# Patient Record
Sex: Female | Born: 1965 | Race: Black or African American | Hispanic: No | Marital: Married | Smoking: Never smoker
Health system: Southern US, Community
[De-identification: ages and names within clinical notes are randomized; demographics above are authoritative.]

## PROBLEM LIST (undated history)

## (undated) DIAGNOSIS — I10 Essential (primary) hypertension: Secondary | ICD-10-CM

## (undated) DIAGNOSIS — L309 Dermatitis, unspecified: Secondary | ICD-10-CM

## (undated) HISTORY — DX: Morbid (severe) obesity due to excess calories: E66.01

## (undated) HISTORY — DX: Essential (primary) hypertension: I10

## (undated) HISTORY — DX: Dermatitis, unspecified: L30.9

---

## 2008-01-31 DIAGNOSIS — L309 Dermatitis, unspecified: Secondary | ICD-10-CM

## 2008-01-31 HISTORY — DX: Dermatitis, unspecified: L30.9

## 2008-07-19 ENCOUNTER — Emergency Department (HOSPITAL_COMMUNITY): Admission: EM | Admit: 2008-07-19 | Discharge: 2008-07-19 | Payer: Self-pay | Admitting: Emergency Medicine

## 2010-08-28 ENCOUNTER — Emergency Department (HOSPITAL_COMMUNITY)
Admission: EM | Admit: 2010-08-28 | Discharge: 2010-08-28 | Disposition: A | Discharge status: Home / Self Care | Payer: Self-pay | Attending: Emergency Medicine | Admitting: Emergency Medicine

## 2010-08-28 ENCOUNTER — Emergency Department (HOSPITAL_COMMUNITY): Payer: Self-pay

## 2010-08-28 DIAGNOSIS — N390 Urinary tract infection, site not specified: Secondary | ICD-10-CM | POA: Insufficient documentation

## 2010-08-28 DIAGNOSIS — A599 Trichomoniasis, unspecified: Secondary | ICD-10-CM | POA: Insufficient documentation

## 2010-08-28 DIAGNOSIS — D259 Leiomyoma of uterus, unspecified: Secondary | ICD-10-CM | POA: Insufficient documentation

## 2010-08-28 DIAGNOSIS — R1031 Right lower quadrant pain: Secondary | ICD-10-CM | POA: Insufficient documentation

## 2010-08-28 DIAGNOSIS — R112 Nausea with vomiting, unspecified: Secondary | ICD-10-CM | POA: Insufficient documentation

## 2010-08-28 LAB — BASIC METABOLIC PANEL
BUN: 11 mg/dL (ref 6–23)
Calcium: 9.6 mg/dL (ref 8.4–10.5)
Creatinine, Ser: 0.92 mg/dL (ref 0.50–1.10)
GFR calc Af Amer: >60 mL/min (ref >60)
GFR calc non Af Amer: >60 mL/min (ref >60)
Glucose, Bld: 102 mg/dL — ABNORMAL HIGH (ref 70–99)

## 2010-08-28 LAB — DIFFERENTIAL
Basophils Absolute: 0 10*3/uL (ref 0.0–0.1)
Eosinophils Relative: 1 % (ref 0–5)
Lymphocytes Relative: 20 % (ref 12–46)
Lymphs Abs: 1.7 10*3/uL (ref 0.7–4.0)
Monocytes Absolute: 1.4 10*3/uL — ABNORMAL HIGH (ref 0.1–1.0)
Monocytes Relative: 16 % — ABNORMAL HIGH (ref 3–12)
Neutro Abs: 5.6 10*3/uL (ref 1.7–7.7)

## 2010-08-28 LAB — URINE MICROSCOPIC-ADD ON

## 2010-08-28 LAB — URINALYSIS, ROUTINE W REFLEX MICROSCOPIC
Glucose, UA: NEGATIVE mg/dL
Nitrite: NEGATIVE
Protein, ur: 30 mg/dL — AB

## 2010-08-28 LAB — CBC
HCT: 37.1 % (ref 36.0–46.0)
Hemoglobin: 13.5 g/dL (ref 12.0–15.0)
MCH: 33.3 pg (ref 26.0–34.0)
MCHC: 36.4 g/dL — ABNORMAL HIGH (ref 30.0–36.0)
MCV: 91.6 fL (ref 78.0–100.0)
RDW: 14 % (ref 11.5–15.5)

## 2010-08-28 LAB — POCT PREGNANCY, URINE: Preg Test, Ur: NEGATIVE

## 2010-08-28 LAB — WET PREP, GENITAL: Clue Cells Wet Prep HPF POC: NONE SEEN

## 2010-08-28 MED ORDER — IOHEXOL 300 MG/ML  SOLN
120.0000 mL | Freq: Once | INTRAMUSCULAR | Status: AC | PRN
  Administered 2010-08-28: 120 mL via INTRAVENOUS

## 2010-08-29 LAB — GC/CHLAMYDIA PROBE AMP, GENITAL: Chlamydia, DNA Probe: NEGATIVE

## 2011-10-12 ENCOUNTER — Telehealth: Payer: Self-pay | Admitting: Family Medicine

## 2011-10-12 NOTE — Telephone Encounter (Signed)
Caller: [name]/Patient; Patient Name: Melanie Bradshaw; PCP: Eustaquio Boyden [facility]); Best Callback Phone Number: [phone]; Reason for call: Cough/Congestion.  Patient states she was seen by Dr. Shawn Route MD last week 10/06/11  for a viral/laryngitis. She was placed on a Zpack and took her last pill on Tuesday 10/10/11. She reports that she is so much better but has developed a slight cough.   She states she feels good just low energy. non productive cough. Eating and drinking well. Afebrile. Voice is clear. Emergent s/sx ruled out per Cough protocol with exception to "Dry cough". Home care instructions provided. Encouraged to closely mointor signs and symptoms. Call back for questions, changes or concerns.

## 2011-10-12 NOTE — Telephone Encounter (Signed)
Noted thanks °

## 2012-12-18 ENCOUNTER — Ambulatory Visit: Payer: No Typology Code available for payment source | Attending: Internal Medicine | Admitting: Internal Medicine

## 2012-12-18 ENCOUNTER — Encounter: Payer: Self-pay | Admitting: Internal Medicine

## 2012-12-18 VITALS — BP 147/95 | HR 71 | Temp 98.6°F | Ht 64.0 in | Wt 248.6 lb

## 2012-12-18 DIAGNOSIS — E119 Type 2 diabetes mellitus without complications: Secondary | ICD-10-CM

## 2012-12-18 LAB — LIPID PANEL
HDL: 67 mg/dL (ref >39)
LDL Cholesterol: 104 mg/dL — ABNORMAL HIGH (ref 0–99)
Total CHOL/HDL Ratio: 2.8 Ratio
VLDL: 19 mg/dL (ref 0–40)

## 2012-12-18 LAB — CBC WITH DIFFERENTIAL/PLATELET
Basophils Absolute: 0 10*3/uL (ref 0.0–0.1)
Eosinophils Relative: 3 % (ref 0–5)
HCT: 35 % — ABNORMAL LOW (ref 36.0–46.0)
Lymphocytes Relative: 32 % (ref 12–46)
Lymphs Abs: 1.6 10*3/uL (ref 0.7–4.0)
MCV: 90.4 fL (ref 78.0–100.0)
Monocytes Absolute: 0.7 10*3/uL (ref 0.1–1.0)
Neutro Abs: 2.6 10*3/uL (ref 1.7–7.7)
Platelets: 257 10*3/uL (ref 150–400)
RBC: 3.87 MIL/uL (ref 3.87–5.11)
RDW: 14.9 % (ref 11.5–15.5)
WBC: 5.1 10*3/uL (ref 4.0–10.5)

## 2012-12-18 LAB — COMPLETE METABOLIC PANEL WITH GFR
ALT: 12 U/L (ref 0–35)
AST: 17 U/L (ref 0–37)
Albumin: 3.8 g/dL (ref 3.5–5.2)
CO2: 29 mEq/L (ref 19–32)
Calcium: 9 mg/dL (ref 8.4–10.5)
Chloride: 103 mEq/L (ref 96–112)
GFR, Est African American: 82 mL/min
Potassium: 3.6 mEq/L (ref 3.5–5.3)

## 2012-12-18 MED ORDER — KETOCONAZOLE 2 % EX CREA: 1.0000 "application " | TOPICAL_CREAM | Freq: Every day | CUTANEOUS | Status: DC

## 2012-12-18 MED ORDER — TRIAMCINOLONE ACETONIDE 0.1 % EX CREA: 1.0000 "application " | TOPICAL_CREAM | Freq: Two times a day (BID) | CUTANEOUS | Status: DC

## 2012-12-18 MED ORDER — CLOBETASOL PROP EMOLLIENT BASE 0.05 % EX CREA: 1.0000 | TOPICAL_CREAM | Freq: Two times a day (BID) | CUTANEOUS | Status: DC

## 2012-12-18 MED ORDER — AMLODIPINE BESYLATE 10 MG PO TABS: 10.0000 mg | ORAL_TABLET | Freq: Every day | ORAL | Status: DC

## 2012-12-18 NOTE — Progress Notes (Unsigned)
Patient ID: Melanie Bradshaw, female   DOB: 06/30/1965, 47 y.o.   MRN: [id number]  CC:  HPI:  47 year old female is here to establish care The patient primarily complains of an excoriating, eczematous Dermatitis on both her hands. She works as a Chemical engineer, she is unaware of allergy to latex. She does not have the rash anywhere else on her body. She has recently not used any new soaps and shampoos  She denies any joint pain, chest pain, shortness of breath or any other cardiopulmonary symptoms She is a nonsmoker She is morbidly obese She states that she has a history of high blood pressure needs to be on Norvasc which she had to discontinue She is currently not up-to-date with her immunizations or her Pap smear/breast exam   Not on File History reviewed. No pertinent past medical history. No current outpatient prescriptions on file prior to visit.   No current facility-administered medications on file prior to visit.   Family History  Problem Relation Age of Onset  . Diabetes Mother   . Hypertension Mother   . Diabetes Sister   . Diabetes Brother    History   Social History  . Marital Status: Single    Spouse Name: N/A    Number of Children: N/A  . Years of Education: N/A   Occupational History  . Not on file.   Social History Main Topics  . Smoking status: Never Smoker   . Smokeless tobacco: Not on file  . Alcohol Use: No  . Drug Use: No  . Sexual Activity: Not on file   Other Topics Concern  . Not on file   Social History Narrative  . No narrative on file    Review of Systems  Constitutional: Negative for fever, chills, diaphoresis, activity change, appetite change and fatigue.  HENT: Negative for ear pain, nosebleeds, congestion, facial swelling, rhinorrhea, neck pain, neck stiffness and ear discharge.   Eyes: Negative for pain, discharge, redness, itching and visual disturbance.  Respiratory: Negative for cough, choking, chest tightness, shortness of  breath, wheezing and stridor.   Cardiovascular: Negative for chest pain, palpitations and leg swelling.  Gastrointestinal: Negative for abdominal distention.  Genitourinary: Negative for dysuria, urgency, frequency, hematuria, flank pain, decreased urine volume, difficulty urinating and dyspareunia.  Musculoskeletal: Negative for back pain, joint swelling, arthralgias and gait problem.  Neurological: Negative for dizziness, tremors, seizures, syncope, facial asymmetry, speech difficulty, weakness, light-headedness, numbness and headaches.  Hematological: Negative for adenopathy. Does not bruise/bleed easily.  Psychiatric/Behavioral: Negative for hallucinations, behavioral problems, confusion, dysphoric mood, decreased concentration and agitation.    Objective:   Filed Vitals:   12/18/12 1631  BP: 147/95  Pulse: 71  Temp: 98.6 F (37 C)    Physical Exam  Constitutional: Appears well-developed and well-nourished. No distress.  HENT: Normocephalic. External right and left ear normal. Oropharynx is clear and moist.  Eyes: Conjunctivae and EOM are normal. PERRLA, no scleral icterus.  Neck: Normal ROM. Neck supple. No JVD. No tracheal deviation. No thyromegaly.  CVS: RRR, S1/S2 +, no murmurs, no gallops, no carotid bruit.  Pulmonary: Effort and breath sounds normal, no stridor, rhonchi, wheezes, rales.  Abdominal: Soft. BS +,  no distension, tenderness, rebound or guarding.  Musculoskeletal: Normal range of motion. No edema and no tenderness.  Lymphadenopathy: No lymphadenopathy noted, cervical, inguinal. Neuro: Alert. Normal reflexes, muscle tone coordination. No cranial nerve deficit. Skin: Skin is warm and dry. No rash noted. Not diaphoretic. No erythema. No pallor.  Psychiatric: Normal mood and affect. Behavior, judgment, thought content normal.   Lab Results  Component Value Date   WBC 8.8 08/28/2010   HGB 13.5 08/28/2010   HCT 37.1 08/28/2010   MCV 91.6 08/28/2010   PLT 221  08/28/2010   Lab Results  Component Value Date   CREATININE 0.92 08/28/2010   BUN 11 08/28/2010   NA 134* 08/28/2010   K 3.0* 08/28/2010   CL 97 08/28/2010   CO2 25 08/28/2010    No results found for this basename: HGBA1C   Lipid Panel  No results found for this basename: chol, trig, hdl, cholhdl, vldl, ldlcalc       Assessment and plan:   There are no active problems to display for this patient.      Contact dermatitis The patient has been prescribed clobetasol ointment Dermatology referral   Tinea corporis Ketoconazole shampoo   Hypertension Norvasc 10 mg a day  Healthcare maintenance Pap smear/breast exam-gynecologic referral Will provide flu vaccination Routine labs including kidney function CBC, lipid panel, A1c, TSH, vitamin D   Followup in 2 months for a blood pressure check  The patient was given clear instructions to go to ER or return to medical center if symptoms don't improve, worsen or new problems develop. The patient verbalized understanding. The patient was told to call to get any lab results if not heard anything in the next week.

## 2012-12-18 NOTE — Progress Notes (Unsigned)
Pt is here to establish care. Pt complains of possible hypertension; BP going up and down consistently. Also complains of mild breakout (rash) on bilateral hands with severe itching. Pt requests a HBA1c to determine diabetes due to family history.

## 2012-12-19 LAB — HEMOGLOBIN A1C
Hgb A1c MFr Bld: 5.8 % — ABNORMAL HIGH (ref <5.7)
Mean Plasma Glucose: 120 mg/dL — ABNORMAL HIGH (ref <117)

## 2013-02-17 ENCOUNTER — Encounter: Payer: Self-pay | Admitting: Internal Medicine

## 2013-02-17 ENCOUNTER — Ambulatory Visit: Payer: No Typology Code available for payment source | Attending: Internal Medicine | Admitting: Internal Medicine

## 2013-02-17 VITALS — BP 132/91 | HR 67 | Temp 98.9°F | Resp 14 | Ht 64.0 in | Wt 260.2 lb

## 2013-02-17 DIAGNOSIS — H01139 Eczematous dermatitis of unspecified eye, unspecified eyelid: Secondary | ICD-10-CM

## 2013-02-17 DIAGNOSIS — R21 Rash and other nonspecific skin eruption: Secondary | ICD-10-CM | POA: Insufficient documentation

## 2013-02-17 MED ORDER — TRIAMCINOLONE ACETONIDE 0.1 % EX CREA: 1.0000 "application " | TOPICAL_CREAM | Freq: Two times a day (BID) | CUTANEOUS | Status: DC

## 2013-02-17 NOTE — Progress Notes (Signed)
Patient ID: Melanie Bradshaw, female   DOB: 12-12-1965, 48 y.o.   MRN: [id number]   CC:  HPI: 48 year old female who is here for followup of her rash most likely contact dermatitis, responding to triamcinolone ointment. She is yet to see a dermatologist.   Not on File No past medical history on file. Current Outpatient Prescriptions on File Prior to Visit  Medication Sig Dispense Refill  . amLODipine (NORVASC) 10 MG tablet Take 1 tablet (10 mg total) by mouth daily.  90 tablet  3  . ketoconazole (NIZORAL) 2 % cream Apply 1 application topically daily.  15 g  0   No current facility-administered medications on file prior to visit.   Family History  Problem Relation Age of Onset  . Diabetes Mother   . Hypertension Mother   . Diabetes Sister   . Diabetes Brother    History   Social History  . Marital Status: Single    Spouse Name: N/A    Number of Children: N/A  . Years of Education: N/A   Occupational History  . Not on file.   Social History Main Topics  . Smoking status: Never Smoker   . Smokeless tobacco: Not on file  . Alcohol Use: No  . Drug Use: No  . Sexual Activity: Not on file   Other Topics Concern  . Not on file   Social History Narrative  . No narrative on file    Review of Systems  Constitutional: Negative for fever, chills, diaphoresis, activity change, appetite change and fatigue.  HENT: Negative for ear pain, nosebleeds, congestion, facial swelling, rhinorrhea, neck pain, neck stiffness and ear discharge.   Eyes: Negative for pain, discharge, redness, itching and visual disturbance.  Respiratory: Negative for cough, choking, chest tightness, shortness of breath, wheezing and stridor.   Cardiovascular: Negative for chest pain, palpitations and leg swelling.  Gastrointestinal: Negative for abdominal distention.  Genitourinary: Negative for dysuria, urgency, frequency, hematuria, flank pain, decreased urine volume, difficulty urinating and dyspareunia.   Musculoskeletal: Negative for back pain, joint swelling, arthralgias and gait problem.  Neurological: Negative for dizziness, tremors, seizures, syncope, facial asymmetry, speech difficulty, weakness, light-headedness, numbness and headaches.  Hematological: Negative for adenopathy. Does not bruise/bleed easily.  Psychiatric/Behavioral: Negative for hallucinations, behavioral problems, confusion, dysphoric mood, decreased concentration and agitation.    Objective:   Filed Vitals:   02/17/13 1504  BP: 132/91  Pulse: 67  Temp: 98.9 F (37.2 C)  Resp: 14    Physical Exam  Constitutional: Appears well-developed and well-nourished. No distress.  HENT: Normocephalic. External right and left ear normal. Oropharynx is clear and moist.  Eyes: Conjunctivae and EOM are normal. PERRLA, no scleral icterus.  Neck: Normal ROM. Neck supple. No JVD. No tracheal deviation. No thyromegaly.  CVS: RRR, S1/S2 +, no murmurs, no gallops, no carotid bruit.  Pulmonary: Effort and breath sounds normal, no stridor, rhonchi, wheezes, rales.  Abdominal: Soft. BS +,  no distension, tenderness, rebound or guarding.  Musculoskeletal: Normal range of motion. No edema and no tenderness.  Lymphadenopathy: No lymphadenopathy noted, cervical, inguinal. Neuro: Alert. Normal reflexes, muscle tone coordination. No cranial nerve deficit. Skin: Excoriated macular rash on both palms dorsal and palmar aspect Psychiatric: Normal mood and affect. Behavior, judgment, thought content normal.   Lab Results  Component Value Date   WBC 5.1 12/18/2012   HGB 12.0 12/18/2012   HCT 35.0* 12/18/2012   MCV 90.4 12/18/2012   PLT 257 12/18/2012   Lab Results  Component Value  Date   CREATININE 0.95 12/18/2012   BUN 11 12/18/2012   NA 139 12/18/2012   K 3.6 12/18/2012   CL 103 12/18/2012   CO2 29 12/18/2012    Lab Results  Component Value Date   HGBA1C 5.8* 12/18/2012   Lipid Panel     Component Value Date/Time   CHOL  190 12/18/2012 1649   TRIG 93 12/18/2012 1649   HDL 67 12/18/2012 1649   CHOLHDL 2.8 12/18/2012 1649   VLDL 19 12/18/2012 1649   LDLCALC 104* 12/18/2012 1649       Assessment and plan:   There are no active problems to display for this patient.      Contact dermatitis Continue Kenalog ointment Dermatology referral provided   followup as needed sooner otherwise in 3 months    The patient was given clear instructions to go to ER or return to medical center if symptoms don't improve, worsen or new problems develop. The patient verbalized understanding. The patient was told to call to get any lab results if not heard anything in the next week.

## 2013-02-17 NOTE — Progress Notes (Signed)
Pt is here for a f/u. The cream prescribed has helped. Requests a refill for creams.

## 2013-02-26 ENCOUNTER — Encounter: Payer: Self-pay | Admitting: Obstetrics & Gynecology

## 2013-02-26 ENCOUNTER — Ambulatory Visit (INDEPENDENT_AMBULATORY_CARE_PROVIDER_SITE_OTHER): Payer: No Typology Code available for payment source | Admitting: Obstetrics & Gynecology

## 2013-02-26 VITALS — BP 121/84 | HR 74 | Temp 98.6°F | Ht 64.0 in | Wt 245.2 lb

## 2013-02-26 DIAGNOSIS — Z0000 Encounter for general adult medical examination without abnormal findings: Secondary | ICD-10-CM

## 2013-02-26 NOTE — Progress Notes (Signed)
Subjective:    Melanie Bradshaw is a 48 y.o. female who presents for an annual exam. The patient has no complaints today. She has monthly periods that last 5 days. The patient is sexually active. GYN screening history: last pap: was normal. The patient wears seatbelts: yes. The patient participates in regular exercise: yes. Has the patient ever been transfused or tattooed?: no. The patient reports that there is not domestic violence in her life.   Menstrual History: OB History   Grav Para Term Preterm Abortions TAB SAB Ect Mult Living                  Menarche age: 59 Patient's last menstrual period was 02/10/2013.    The following portions of the patient's history were reviewed and updated as appropriate: allergies, current medications, past family history, past medical history, past social history, past surgical history and problem list.  Review of Systems A comprehensive review of systems was negative. She uses condoms when she has sex. She lives with her boyfriend for 6 years. 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 She had a flu vaccine. She thinks that her last mammogram was within the last 2 years.   Objective:    BP 121/84  Pulse 74  Temp(Src) 98.6 F (37 C)  Ht 5\' 4"  (1.626 m)  Wt 245 lb 3.2 oz (111.222 kg)  BMI 42.07 kg/m2  LMP 02/10/2013  General Appearance:    Alert, cooperative, no distress, appears stated age  Head:    Normocephalic, without obvious abnormality, atraumatic  Eyes:    PERRL, conjunctiva/corneas clear, EOM's intact, fundi    benign, both eyes  Ears:    Normal TM's and external ear canals, both ears  Nose:   Nares normal, septum midline, mucosa normal, no drainage    or sinus tenderness  Throat:   Lips, mucosa, and tongue normal; teeth and gums normal  Neck:   Supple, symmetrical, trachea midline, no adenopathy;    thyroid:  no enlargement/tenderness/nodules; no carotid   bruit or JVD  Back:     Symmetric, no curvature, ROM normal, no CVA tenderness  Lungs:     Clear to  auscultation bilaterally, respirations unlabored  Chest Wall:    No tenderness or deformity   Heart:    Regular rate and rhythm, S1 and S2 normal, no murmur, rub   or gallop  Breast Exam:    No tenderness, masses, or nipple abnormality  Abdomen:     Soft, non-tender, bowel sounds active all four quadrants,    no masses, no organomegaly  Genitalia:    Normal female without lesion, discharge or tenderness, NSSA, NT, no adnexal masses felt on exam     Extremities:   Extremities normal, atraumatic, no cyanosis or edema  Pulses:   2+ and symmetric all extremities  Skin:   Skin color, texture, turgor normal, no rashes or lesions  Lymph nodes:   Cervical, supraclavicular, and axillary nodes normal  Neurologic:   CNII-XII intact, normal strength, sensation and reflexes    throughout  .    Assessment:    Healthy female exam.  She requests STI testing   Plan:     Breast self exam technique reviewed and patient encouraged to perform self-exam monthly. Mammogram. Thin prep Pap smear. with cotesting

## 2013-02-26 NOTE — Patient Instructions (Signed)

## 2013-02-27 LAB — HIV ANTIBODY (ROUTINE TESTING W REFLEX): HIV: NONREACTIVE

## 2013-02-27 LAB — RPR

## 2013-02-27 LAB — HEPATITIS C ANTIBODY: HCV AB: NEGATIVE

## 2013-02-27 LAB — HEPATITIS B SURFACE ANTIGEN: HEP B S AG: NEGATIVE

## 2013-03-03 ENCOUNTER — Encounter: Payer: Self-pay | Admitting: *Deleted

## 2013-03-04 ENCOUNTER — Telehealth: Payer: Self-pay | Admitting: *Deleted

## 2013-03-04 DIAGNOSIS — A599 Trichomoniasis, unspecified: Secondary | ICD-10-CM

## 2013-03-04 MED ORDER — METRONIDAZOLE 500 MG PO TABS: 2000.0000 mg | ORAL_TABLET | Freq: Once | ORAL | Status: DC

## 2013-03-04 NOTE — Telephone Encounter (Signed)
Pt called the front desk and I informed pt that she tested positive for Trich.  I explained to her that it was an STD and that treatment was sent to her [place] and to please take all four pills at one time to be treated properly.  To also get her partner(s) treated as well before having intercourse.  Pt stated understanding with no further questions.

## 2013-03-04 NOTE — Telephone Encounter (Signed)
Message copied by Sue Lush on Tue Mar 04, 2013  2:46 PM ------      Message from: Clovia Cuff C      Created: Mon Mar 03, 2013  1:41 PM       She needs 2 grams of flagyl po once for trich. Her partner should be treated before she has sex with him again. ------

## 2013-03-04 NOTE — Telephone Encounter (Signed)
Message from Dr. Hulan Fray to order flagyl 2 gms one time does for trich.  Ordered.  Attempted to call pt, left message for her to call back to the clinic.

## 2013-03-11 ENCOUNTER — Ambulatory Visit: Payer: No Typology Code available for payment source | Attending: Internal Medicine

## 2013-03-17 ENCOUNTER — Ambulatory Visit (HOSPITAL_COMMUNITY)
Admission: RE | Admit: 2013-03-17 | Discharge: 2013-03-17 | Disposition: A | Discharge status: Home / Self Care | Payer: No Typology Code available for payment source | Source: Ambulatory Visit | Attending: Obstetrics & Gynecology | Admitting: Obstetrics & Gynecology

## 2013-03-17 DIAGNOSIS — Z0000 Encounter for general adult medical examination without abnormal findings: Secondary | ICD-10-CM

## 2013-05-19 ENCOUNTER — Ambulatory Visit: Payer: No Typology Code available for payment source | Attending: Internal Medicine | Admitting: Internal Medicine

## 2013-05-19 ENCOUNTER — Encounter: Payer: Self-pay | Admitting: Internal Medicine

## 2013-05-19 VITALS — BP 148/99 | HR 63 | Temp 98.4°F | Resp 16 | Ht 64.0 in | Wt 254.0 lb

## 2013-05-19 DIAGNOSIS — I10 Essential (primary) hypertension: Secondary | ICD-10-CM

## 2013-05-19 DIAGNOSIS — N390 Urinary tract infection, site not specified: Secondary | ICD-10-CM

## 2013-05-19 DIAGNOSIS — L259 Unspecified contact dermatitis, unspecified cause: Secondary | ICD-10-CM

## 2013-05-19 DIAGNOSIS — Z09 Encounter for follow-up examination after completed treatment for conditions other than malignant neoplasm: Secondary | ICD-10-CM | POA: Insufficient documentation

## 2013-05-19 LAB — POCT URINALYSIS DIPSTICK
Bilirubin, UA: NEGATIVE
GLUCOSE UA: NEGATIVE
KETONES UA: NEGATIVE
Nitrite, UA: NEGATIVE
PROTEIN UA: NEGATIVE
Spec Grav, UA: 1.02
Urobilinogen, UA: 0.2
pH, UA: 5.5

## 2013-05-19 MED ORDER — AMLODIPINE BESYLATE 10 MG PO TABS: 10.0000 mg | ORAL_TABLET | Freq: Every day | ORAL | Status: DC

## 2013-05-19 MED ORDER — CIPROFLOXACIN HCL 500 MG PO TABS: 500.0000 mg | ORAL_TABLET | Freq: Two times a day (BID) | ORAL | Status: DC

## 2013-05-19 NOTE — Patient Instructions (Signed)
Urinary Tract Infection  Urinary tract infections (UTIs) can develop anywhere along your urinary tract. Your urinary tract is your body's drainage system for removing wastes and extra water. Your urinary tract includes two kidneys, two ureters, a bladder, and a urethra. Your kidneys are a pair of bean-shaped organs. Each kidney is about the size of your fist. They are located below your ribs, one on each side of your spine.  CAUSES  Infections are caused by microbes, which are microscopic organisms, including fungi, viruses, and bacteria. These organisms are so small that they can only be seen through a microscope. Bacteria are the microbes that most commonly cause UTIs.  SYMPTOMS   Symptoms of UTIs may vary by age and gender of the patient and by the location of the infection. Symptoms in young women typically include a frequent and intense urge to urinate and a painful, burning feeling in the bladder or urethra during urination. Older women and men are more likely to be tired, shaky, and weak and have muscle aches and abdominal pain. A fever may mean the infection is in your kidneys. Other symptoms of a kidney infection include pain in your back or sides below the ribs, nausea, and vomiting.  DIAGNOSIS  To diagnose a UTI, your caregiver will ask you about your symptoms. Your caregiver also will ask to provide a urine sample. The urine sample will be tested for bacteria and white blood cells. White blood cells are made by your body to help fight infection.  TREATMENT   Typically, UTIs can be treated with medication. Because most UTIs are caused by a bacterial infection, they usually can be treated with the use of antibiotics. The choice of antibiotic and length of treatment depend on your symptoms and the type of bacteria causing your infection.  HOME CARE INSTRUCTIONS   If you were prescribed antibiotics, take them exactly as your caregiver instructs you. Finish the medication even if you feel better after you  have only taken some of the medication.   Drink enough water and fluids to keep your urine clear or pale yellow.   Avoid caffeine, tea, and carbonated beverages. They tend to irritate your bladder.   Empty your bladder often. Avoid holding urine for long periods of time.   Empty your bladder before and after sexual intercourse.   After a bowel movement, women should cleanse from front to back. Use each tissue only once.  SEEK MEDICAL CARE IF:    You have back pain.   You develop a fever.   Your symptoms do not begin to resolve within 3 days.  SEEK IMMEDIATE MEDICAL CARE IF:    You have severe back pain or lower abdominal pain.   You develop chills.   You have nausea or vomiting.   You have continued burning or discomfort with urination.  MAKE SURE YOU:    Understand these instructions.   Will watch your condition.   Will get help right away if you are not doing well or get worse.  Document Released: 10/26/2004 Document Revised: 07/18/2011 Document Reviewed: 02/24/2011  ExitCare Patient Information 2014 [employer].

## 2013-05-19 NOTE — Progress Notes (Signed)
Patient ID: Melanie Bradshaw, female   DOB: 1965-11-29, 48 y.o.   MRN: [id number]   Melanie Bradshaw, is a 48 y.o. female  [id number]  [id number]  DOB - 02-16-1965  Chief Complaint  Patient presents with  . Follow-up        Subjective:   Melanie Bradshaw is a 48 y.o. female here today for a follow up visit. Patient has history of hypertension on Norvasc 10 mg tablet by mouth daily, she has not taken her medication today because she ran out. She is here today for followup. Her major complaint is frequency of urination, she also thinks her urine is foul smelling but no dysuria. She was recently treated for Trichomonas. She denies any vaginal discharge. She claims there is significant improvement on her contact dermatitis in both hands.  Patient has No headache, No chest pain, No abdominal pain - No Nausea, No new weakness tingling or numbness, No Cough - SOB.  Problem  Uti (Urinary Tract Infection)  Contact Dermatitis  Htn (Hypertension)    ALLERGIES: No Known Allergies  PAST MEDICAL HISTORY: Past Medical History  Diagnosis Date  . Morbid obesity     MEDICATIONS AT HOME: Prior to Admission medications   Medication Sig Start Date End Date Taking? Authorizing Provider  amLODipine (NORVASC) 10 MG tablet Take 1 tablet (10 mg total) by mouth daily. 05/19/13  Yes Melanie Chessman, MD  ciprofloxacin (CIPRO) 500 MG tablet Take 1 tablet (500 mg total) by mouth 2 (two) times daily. 05/19/13   Melanie Chessman, MD  ketoconazole (NIZORAL) 2 % cream Apply 1 application topically daily. 12/18/12   Reyne Dumas, MD  metroNIDAZOLE (FLAGYL) 500 MG tablet Take 4 tablets (2,000 mg total) by mouth once. 03/04/13   Osborne Oman, MD  triamcinolone cream (KENALOG) 0.1 % Apply 1 application topically 2 (two) times daily. Both hands 02/17/13   Reyne Dumas, MD     Objective:   Filed Vitals:   05/19/13 1407  BP: 148/99  Pulse: 63  Temp: 98.4 F (36.9 C)  TempSrc: Oral  Resp: 16  Height: 5\' 4"   (1.626 m)  Weight: 254 lb (115.214 kg)  SpO2: 97%    Exam General appearance : Awake, alert, not in any distress. Speech Clear. Not toxic looking, morbidly obese, poor dentition HEENT: Atraumatic and Normocephalic, pupils equally reactive to light and accomodation Neck: supple, no JVD. No cervical lymphadenopathy.  Chest:Good air entry bilaterally, no added sounds  CVS: S1 S2 regular, no murmurs.  Abdomen: Bowel sounds present, Non tender and not distended with no gaurding, rigidity or rebound. Extremities: B/L Lower Ext shows no edema, both legs are warm to touch Neurology: Awake alert, and oriented X 3, CN II-XII intact, Non focal Skin: rash on both hands in a glove distribution, also facial rash mostly around the eye lids  Data Review Lab Results  Component Value Date   HGBA1C 5.8* 12/18/2012     Assessment & Plan   1. HTN (hypertension) Refill - amLODipine (NORVASC) 10 MG tablet; Take 1 tablet (10 mg total) by mouth daily.  Dispense: 90 tablet; Refill: 3 DASH diet  2. Contact dermatitis Continue triamcinolone cream 0.1%  3. UTI (urinary tract infection)  - CULTURE, URINE COMPREHENSIVE - ciprofloxacin (CIPRO) 500 MG tablet; Take 1 tablet (500 mg total) by mouth 2 (two) times daily.  Dispense: 10 tablet; Refill: 0  Patient was counseled extensively on nutrition and exercise  Return in about 6 months (around 11/18/2013), or if symptoms worsen or fail  to improve, for Follow up HTN.  The patient was given clear instructions to go to ER or return to medical center if symptoms don't improve, worsen or new problems develop. The patient verbalized understanding. The patient was told to call to get lab results if they haven't heard anything in the next week.   This note has been created with Surveyor, quantity. Any transcriptional errors are unintentional.    Melanie Chessman, MD, [place], [place], [place] and  [facility] [place], [place]   05/19/2013, 2:42 PM

## 2013-05-19 NOTE — Progress Notes (Signed)
Pt is here following up on her HTN and eczema. Pt has no new C.C. Today.

## 2013-05-21 LAB — CULTURE, URINE COMPREHENSIVE

## 2013-06-13 ENCOUNTER — Other Ambulatory Visit: Payer: Self-pay

## 2013-06-13 DIAGNOSIS — H01139 Eczematous dermatitis of unspecified eye, unspecified eyelid: Secondary | ICD-10-CM

## 2013-10-09 ENCOUNTER — Ambulatory Visit: Payer: Self-pay

## 2013-11-18 ENCOUNTER — Ambulatory Visit: Payer: Self-pay | Admitting: Internal Medicine

## 2013-11-24 ENCOUNTER — Encounter: Payer: Self-pay | Admitting: Internal Medicine

## 2013-11-24 ENCOUNTER — Ambulatory Visit: Payer: Self-pay | Attending: Internal Medicine | Admitting: Internal Medicine

## 2013-11-24 VITALS — BP 164/100 | HR 57 | Temp 98.7°F | Resp 14 | Ht 64.0 in | Wt 248.0 lb

## 2013-11-24 DIAGNOSIS — L309 Dermatitis, unspecified: Secondary | ICD-10-CM | POA: Insufficient documentation

## 2013-11-24 DIAGNOSIS — Z792 Long term (current) use of antibiotics: Secondary | ICD-10-CM | POA: Insufficient documentation

## 2013-11-24 DIAGNOSIS — Z23 Encounter for immunization: Secondary | ICD-10-CM | POA: Insufficient documentation

## 2013-11-24 DIAGNOSIS — I10 Essential (primary) hypertension: Secondary | ICD-10-CM | POA: Insufficient documentation

## 2013-11-24 MED ORDER — AMLODIPINE BESYLATE 10 MG PO TABS: 10.0000 mg | ORAL_TABLET | Freq: Every day | ORAL | Status: DC

## 2013-11-24 NOTE — Progress Notes (Signed)
Patient ID: Melanie Bradshaw, female   DOB: 05-26-1965, 48 y.o.   MRN: [id number]   Melanie Bradshaw, is a 48 y.o. female  [id number]  [id number]  DOB - 1965-12-03  Chief Complaint  Patient presents with  . Follow-up        Subjective:   Melanie Bradshaw is a 48 y.o. female here today for a follow up visit. PMH significant for HTN and eczema.  She does not smoke and she consumes 1 beer/night.  She reports that she is unable to afford her antihypertensive medication because she does not receive many hours at work.  She denies any specific complaints and she is requesting her influenza vaccination today.  ALLERGIES: No Known Allergies  PAST MEDICAL HISTORY: Past Medical History  Diagnosis Date  . Morbid obesity     MEDICATIONS AT HOME: Prior to Admission medications   Medication Sig Start Date End Date Taking? Authorizing Provider  amLODipine (NORVASC) 10 MG tablet Take 1 tablet (10 mg total) by mouth daily. 05/19/13   Tresa Garter, MD  ciprofloxacin (CIPRO) 500 MG tablet Take 1 tablet (500 mg total) by mouth 2 (two) times daily. 05/19/13   Tresa Garter, MD  ketoconazole (NIZORAL) 2 % cream Apply 1 application topically daily. 12/18/12   Reyne Dumas, MD  metroNIDAZOLE (FLAGYL) 500 MG tablet Take 4 tablets (2,000 mg total) by mouth once. 03/04/13   Osborne Oman, MD  triamcinolone cream (KENALOG) 0.1 % Apply 1 application topically 2 (two) times daily. Both hands 02/17/13   Reyne Dumas, MD   Review of Systems  Constitutional: Negative.   HENT: Negative.   Eyes: Negative.   Respiratory: Negative.   Cardiovascular: Negative.   Gastrointestinal: Negative.   Genitourinary: Negative.   Musculoskeletal: Negative.   Skin: Positive for rash.       chronic  Neurological: Negative.   Endo/Heme/Allergies: Negative.   Psychiatric/Behavioral: Negative.      Objective:   Filed Vitals:   11/24/13 1148  BP: 164/100  Pulse: 57  Temp: 98.7 F (37.1 C)  TempSrc: Oral    Resp: 14  Height: 5\' 4"  (1.626 m)  Weight: 248 lb (112.492 kg)  SpO2: 99%    Exam General appearance : Awake, alert, not in any distress. Speech Clear. Not toxic looking HEENT: Atraumatic and Normocephalic, pupils equally reactive to light and accomodation Neck: supple, no JVD. No cervical lymphadenopathy.  Chest:Good air entry bilaterally, no added sounds  CVS: S1 S2 regular, no murmurs.  Abdomen: Bowel sounds present, Non tender and not distended with no gaurding, rigidity or rebound. Extremities: B/L Lower Ext shows no edema, both legs are warm to touch Neurology: Awake alert, and oriented X 3, CN II-XII intact, Non focal Skin:Chronic eczema rash on bilateral hands Wounds:N/A  Data Review Lab Results  Component Value Date   HGBA1C 5.8* 12/18/2012     Assessment & Plan   1. HTN  P: Her BP is currently uncontrolled.  We spoke at length about the risk factors of untreated HTN including but not limited to MI, CVA, renal failure, and vision loss.  She did not realize the multitude of end-organ damage from uncontrolled HTN.  We discussed ideas on how she can afford $4 a month for her medication.  She reports that she will try to figure out a way to pay of the meds.       The patient was given clear instructions to go to ER or return to medical center if symptoms don't improve,  worsen or new problems develop. The patient verbalized understanding. The patient was told to call to get lab results if they haven't heard anything in the next week.   This note has been created with Surveyor, quantity. Any transcriptional errors are unintentional.    Shruti Arrey, FNP-student  Evaluation and management procedures were performed by the Advanced Practitioner under my supervision and collaboration. I have reviewed the Advanced Practitioner's note and chart, and I agree with the management and plan.   Angelica Chessman, MD, [place], [place],  [place] [facility] [place], [place]   11/24/2013, 12:07 PM

## 2013-11-24 NOTE — Patient Instructions (Signed)
DASH Eating Plan DASH stands for "Dietary Approaches to Stop Hypertension." The DASH eating plan is a healthy eating plan that has been shown to reduce high blood pressure (hypertension). Additional health benefits may include reducing the risk of type 2 diabetes mellitus, heart disease, and stroke. The DASH eating plan may also help with weight loss. WHAT DO I NEED TO KNOW ABOUT THE DASH EATING PLAN? For the DASH eating plan, you will follow these general guidelines:  Choose foods with a percent daily value for sodium of less than 5% (as listed on the food label).  Use salt-free seasonings or herbs instead of table salt or sea salt.  Check with your health care provider or pharmacist before using salt substitutes.  Eat lower-sodium products, often labeled as "lower sodium" or "no salt added."  Eat fresh foods.  Eat more vegetables, fruits, and low-fat dairy products.  Choose whole grains. Look for the word "whole" as the first word in the ingredient list.  Choose fish and skinless chicken or turkey more often than red meat. Limit fish, poultry, and meat to 6 oz (170 g) each day.  Limit sweets, desserts, sugars, and sugary drinks.  Choose heart-healthy fats.  Limit cheese to 1 oz (28 g) per day.  Eat more home-cooked food and less restaurant, buffet, and fast food.  Limit fried foods.  Cook foods using methods other than frying.  Limit canned vegetables. If you do use them, rinse them well to decrease the sodium.  When eating at a restaurant, ask that your food be prepared with less salt, or no salt if possible. WHAT FOODS CAN I EAT? Seek help from a dietitian for individual calorie needs. Grains Whole grain or whole wheat bread. Brown rice. Whole grain or whole wheat pasta. Quinoa, bulgur, and whole grain cereals. Low-sodium cereals. Corn or whole wheat flour tortillas. Whole grain cornbread. Whole grain crackers. Low-sodium crackers. Vegetables Fresh or frozen vegetables  (raw, steamed, roasted, or grilled). Low-sodium or reduced-sodium tomato and vegetable juices. Low-sodium or reduced-sodium tomato sauce and paste. Low-sodium or reduced-sodium canned vegetables.  Fruits All fresh, canned (in natural juice), or frozen fruits. Meat and Other Protein Products Ground beef (85% or leaner), grass-fed beef, or beef trimmed of fat. Skinless chicken or turkey. Ground chicken or turkey. Pork trimmed of fat. All fish and seafood. Eggs. Dried beans, peas, or lentils. Unsalted nuts and seeds. Unsalted canned beans. Dairy Low-fat dairy products, such as skim or 1% milk, 2% or reduced-fat cheeses, low-fat ricotta or cottage cheese, or plain low-fat yogurt. Low-sodium or reduced-sodium cheeses. Fats and Oils Tub margarines without trans fats. Light or reduced-fat mayonnaise and salad dressings (reduced sodium). Avocado. Safflower, olive, or canola oils. Natural peanut or almond butter. Other Unsalted popcorn and pretzels. The items listed above may not be a complete list of recommended foods or beverages. Contact your dietitian for more options. WHAT FOODS ARE NOT RECOMMENDED? Grains White bread. White pasta. White rice. Refined cornbread. Bagels and croissants. Crackers that contain trans fat. Vegetables Creamed or fried vegetables. Vegetables in a cheese sauce. Regular canned vegetables. Regular canned tomato sauce and paste. Regular tomato and vegetable juices. Fruits Dried fruits. Canned fruit in light or heavy syrup. Fruit juice. Meat and Other Protein Products Fatty cuts of meat. Ribs, chicken wings, bacon, sausage, bologna, salami, chitterlings, fatback, hot dogs, bratwurst, and packaged luncheon meats. Salted nuts and seeds. Canned beans with salt. Dairy Whole or 2% milk, cream, half-and-half, and cream cheese. Whole-fat or sweetened yogurt. Full-fat   cheeses or blue cheese. Nondairy creamers and whipped toppings. Processed cheese, cheese spreads, or cheese  curds. Condiments Onion and garlic salt, seasoned salt, table salt, and sea salt. Canned and packaged gravies. Worcestershire sauce. Tartar sauce. Barbecue sauce. Teriyaki sauce. Soy sauce, including reduced sodium. Steak sauce. Fish sauce. Oyster sauce. Cocktail sauce. Horseradish. Ketchup and mustard. Meat flavorings and tenderizers. Bouillon cubes. Hot sauce. Tabasco sauce. Marinades. Taco seasonings. Relishes. Fats and Oils Butter, stick margarine, lard, shortening, ghee, and bacon fat. Coconut, palm kernel, or palm oils. Regular salad dressings. Other Pickles and olives. Salted popcorn and pretzels. The items listed above may not be a complete list of foods and beverages to avoid. Contact your dietitian for more information. WHERE CAN I FIND MORE INFORMATION? National Heart, Lung, and Blood Institute: www.nhlbi.nih.gov/health/health-topics/topics/dash/ Document Released: 01/05/2011 Document Revised: 06/02/2013 Document Reviewed: 11/20/2012 ExitCare Patient Information 2015 [employer]. This information is not intended to replace advice given to you by your health care provider. Make sure you discuss any questions you have with your health care provider. Hypertension Hypertension, commonly called high blood pressure, is when the force of blood pumping through your arteries is too strong. Your arteries are the blood vessels that carry blood from your heart throughout your body. A blood pressure reading consists of a higher number over a lower number, such as 110/72. The higher number (systolic) is the pressure inside your arteries when your heart pumps. The lower number (diastolic) is the pressure inside your arteries when your heart relaxes. Ideally you want your blood pressure below 120/80. Hypertension forces your heart to work harder to pump blood. Your arteries may become narrow or stiff. Having hypertension puts you at risk for heart disease, stroke, and other problems.  RISK  FACTORS Some risk factors for high blood pressure are controllable. Others are not.  Risk factors you cannot control include:   Race. You may be at higher risk if you are African American.  Age. Risk increases with age.  Gender. Men are at higher risk than women before age 45 years. After age 65, women are at higher risk than men. Risk factors you can control include:  Not getting enough exercise or physical activity.  Being overweight.  Getting too much fat, sugar, calories, or salt in your diet.  Drinking too much alcohol. SIGNS AND SYMPTOMS Hypertension does not usually cause signs or symptoms. Extremely high blood pressure (hypertensive crisis) may cause headache, anxiety, shortness of breath, and nosebleed. DIAGNOSIS  To check if you have hypertension, your health care provider will measure your blood pressure while you are seated, with your arm held at the level of your heart. It should be measured at least twice using the same arm. Certain conditions can cause a difference in blood pressure between your right and left arms. A blood pressure reading that is higher than normal on one occasion does not mean that you need treatment. If one blood pressure reading is high, ask your health care provider about having it checked again. TREATMENT  Treating high blood pressure includes making lifestyle changes and possibly taking medicine. Living a healthy lifestyle can help lower high blood pressure. You may need to change some of your habits. Lifestyle changes may include:  Following the DASH diet. This diet is high in fruits, vegetables, and whole grains. It is low in salt, red meat, and added sugars.  Getting at least 2 hours of brisk physical activity every week.  Losing weight if necessary.  Not smoking.  Limiting   alcoholic beverages.  Learning ways to reduce stress. If lifestyle changes are not enough to get your blood pressure under control, your health care provider may  prescribe medicine. You may need to take more than one. Work closely with your health care provider to understand the risks and benefits. HOME CARE INSTRUCTIONS  Have your blood pressure rechecked as directed by your health care provider.   Take medicines only as directed by your health care provider. Follow the directions carefully. Blood pressure medicines must be taken as prescribed. The medicine does not work as well when you skip doses. Skipping doses also puts you at risk for problems.   Do not smoke.   Monitor your blood pressure at home as directed by your health care provider. SEEK MEDICAL CARE IF:   You think you are having a reaction to medicines taken.  You have recurrent headaches or feel dizzy.  You have swelling in your ankles.  You have trouble with your vision. SEEK IMMEDIATE MEDICAL CARE IF:  You develop a severe headache or confusion.  You have unusual weakness, numbness, or feel faint.  You have severe chest or abdominal pain.  You vomit repeatedly.  You have trouble breathing. MAKE SURE YOU:   Understand these instructions.  Will watch your condition.  Will get help right away if you are not doing well or get worse. Document Released: 01/16/2005 Document Revised: 06/02/2013 Document Reviewed: 11/08/2012 ExitCare Patient Information 2015 [employer]. This information is not intended to replace advice given to you by your health care provider. Make sure you discuss any questions you have with your health care provider.  

## 2013-11-24 NOTE — Progress Notes (Signed)
Pt is here following up on her HTN. Pt states that she has been unable to get her medications due to finances.

## 2013-12-08 ENCOUNTER — Ambulatory Visit: Payer: Self-pay | Attending: Internal Medicine | Admitting: *Deleted

## 2013-12-08 VITALS — BP 103/71 | HR 70 | Temp 98.2°F | Resp 18

## 2013-12-08 DIAGNOSIS — I10 Essential (primary) hypertension: Secondary | ICD-10-CM | POA: Insufficient documentation

## 2013-12-08 NOTE — Progress Notes (Signed)
Patient presents for BP check States taking amlodipine daily as directed States feeling "all right" Denies headaches, chest pain, blurred vision  BP 103/71 P 70 SPO2 100% T 98.2 oral R 18  Patient instructed to call for refill of amlodipine at least one week before running out so as not to go without. Patient aware to f/u with PCP in January 2016

## 2014-03-02 ENCOUNTER — Ambulatory Visit: Payer: Self-pay | Attending: Internal Medicine | Admitting: Internal Medicine

## 2014-03-02 ENCOUNTER — Encounter: Payer: Self-pay | Admitting: Internal Medicine

## 2014-03-02 VITALS — BP 116/83 | HR 76 | Temp 98.2°F | Ht 63.0 in | Wt 248.4 lb

## 2014-03-02 DIAGNOSIS — I10 Essential (primary) hypertension: Secondary | ICD-10-CM

## 2014-03-02 LAB — LIPID PANEL
CHOLESTEROL: 182 mg/dL (ref 0–200)
HDL: 89 mg/dL (ref >39)
LDL Cholesterol: 82 mg/dL (ref 0–99)
Total CHOL/HDL Ratio: 2 Ratio
Triglycerides: 53 mg/dL (ref <150)
VLDL: 11 mg/dL (ref 0–40)

## 2014-03-02 LAB — COMPLETE METABOLIC PANEL WITH GFR
ALK PHOS: 57 U/L (ref 39–117)
ALT: 13 U/L (ref 0–35)
AST: 16 U/L (ref 0–37)
Albumin: 3.9 g/dL (ref 3.5–5.2)
BILIRUBIN TOTAL: 0.4 mg/dL (ref 0.2–1.2)
BUN: 12 mg/dL (ref 6–23)
CO2: 26 mEq/L (ref 19–32)
Calcium: 8.7 mg/dL (ref 8.4–10.5)
Chloride: 104 mEq/L (ref 96–112)
Creat: 0.78 mg/dL (ref 0.50–1.10)
GFR, Est African American: >89 mL/min
GFR, Est Non African American: >89 mL/min
Glucose, Bld: 85 mg/dL (ref 70–99)
Potassium: 4.3 mEq/L (ref 3.5–5.3)
Sodium: 136 mEq/L (ref 135–145)
TOTAL PROTEIN: 7.2 g/dL (ref 6.0–8.3)

## 2014-03-02 LAB — CBC WITH DIFFERENTIAL/PLATELET
BASOS ABS: 0 10*3/uL (ref 0.0–0.1)
BASOS PCT: 0 % (ref 0–1)
Eosinophils Absolute: 0.1 10*3/uL (ref 0.0–0.7)
Eosinophils Relative: 2 % (ref 0–5)
HEMATOCRIT: 37.6 % (ref 36.0–46.0)
Hemoglobin: 12.6 g/dL (ref 12.0–15.0)
LYMPHS ABS: 1.7 10*3/uL (ref 0.7–4.0)
Lymphocytes Relative: 35 % (ref 12–46)
MCH: 32.5 pg (ref 26.0–34.0)
MCHC: 33.5 g/dL (ref 30.0–36.0)
MCV: 96.9 fL (ref 78.0–100.0)
MONO ABS: 0.6 10*3/uL (ref 0.1–1.0)
MPV: 9.4 fL (ref 8.6–12.4)
Monocytes Relative: 13 % — ABNORMAL HIGH (ref 3–12)
NEUTROS ABS: 2.4 10*3/uL (ref 1.7–7.7)
NEUTROS PCT: 50 % (ref 43–77)
PLATELETS: 233 10*3/uL (ref 150–400)
RBC: 3.88 MIL/uL (ref 3.87–5.11)
RDW: 14.8 % (ref 11.5–15.5)
WBC: 4.8 10*3/uL (ref 4.0–10.5)

## 2014-03-02 LAB — POCT GLYCOSYLATED HEMOGLOBIN (HGB A1C): Hemoglobin A1C: 5.4

## 2014-03-02 NOTE — Progress Notes (Signed)
Pt here for follow-up on HTN. Pt denies any chest pain, swelling, headaches, vision disturbances at this time. Pt took Norvasc this morning at 8am.

## 2014-03-02 NOTE — Patient Instructions (Signed)
DASH Eating Plan DASH stands for "Dietary Approaches to Stop Hypertension." The DASH eating plan is a healthy eating plan that has been shown to reduce high blood pressure (hypertension). Additional health benefits may include reducing the risk of type 2 diabetes mellitus, heart disease, and stroke. The DASH eating plan may also help with weight loss. WHAT DO I NEED TO KNOW ABOUT THE DASH EATING PLAN? For the DASH eating plan, you will follow these general guidelines:  Choose foods with a percent daily value for sodium of less than 5% (as listed on the food label).  Use salt-free seasonings or herbs instead of table salt or sea salt.  Check with your health care provider or pharmacist before using salt substitutes.  Eat lower-sodium products, often labeled as "lower sodium" or "no salt added."  Eat fresh foods.  Eat more vegetables, fruits, and low-fat dairy products.  Choose whole grains. Look for the word "whole" as the first word in the ingredient list.  Choose fish and skinless chicken or turkey more often than red meat. Limit fish, poultry, and meat to 6 oz (170 g) each day.  Limit sweets, desserts, sugars, and sugary drinks.  Choose heart-healthy fats.  Limit cheese to 1 oz (28 g) per day.  Eat more home-cooked food and less restaurant, buffet, and fast food.  Limit fried foods.  Cook foods using methods other than frying.  Limit canned vegetables. If you do use them, rinse them well to decrease the sodium.  When eating at a restaurant, ask that your food be prepared with less salt, or no salt if possible. WHAT FOODS CAN I EAT? Seek help from a dietitian for individual calorie needs. Grains Whole grain or whole wheat bread. Brown rice. Whole grain or whole wheat pasta. Quinoa, bulgur, and whole grain cereals. Low-sodium cereals. Corn or whole wheat flour tortillas. Whole grain cornbread. Whole grain crackers. Low-sodium crackers. Vegetables Fresh or frozen vegetables  (raw, steamed, roasted, or grilled). Low-sodium or reduced-sodium tomato and vegetable juices. Low-sodium or reduced-sodium tomato sauce and paste. Low-sodium or reduced-sodium canned vegetables.  Fruits All fresh, canned (in natural juice), or frozen fruits. Meat and Other Protein Products Ground beef (85% or leaner), grass-fed beef, or beef trimmed of fat. Skinless chicken or turkey. Ground chicken or turkey. Pork trimmed of fat. All fish and seafood. Eggs. Dried beans, peas, or lentils. Unsalted nuts and seeds. Unsalted canned beans. Dairy Low-fat dairy products, such as skim or 1% milk, 2% or reduced-fat cheeses, low-fat ricotta or cottage cheese, or plain low-fat yogurt. Low-sodium or reduced-sodium cheeses. Fats and Oils Tub margarines without trans fats. Light or reduced-fat mayonnaise and salad dressings (reduced sodium). Avocado. Safflower, olive, or canola oils. Natural peanut or almond butter. Other Unsalted popcorn and pretzels. The items listed above may not be a complete list of recommended foods or beverages. Contact your dietitian for more options. WHAT FOODS ARE NOT RECOMMENDED? Grains White bread. White pasta. White rice. Refined cornbread. Bagels and croissants. Crackers that contain trans fat. Vegetables Creamed or fried vegetables. Vegetables in a cheese sauce. Regular canned vegetables. Regular canned tomato sauce and paste. Regular tomato and vegetable juices. Fruits Dried fruits. Canned fruit in light or heavy syrup. Fruit juice. Meat and Other Protein Products Fatty cuts of meat. Ribs, chicken wings, bacon, sausage, bologna, salami, chitterlings, fatback, hot dogs, bratwurst, and packaged luncheon meats. Salted nuts and seeds. Canned beans with salt. Dairy Whole or 2% milk, cream, half-and-half, and cream cheese. Whole-fat or sweetened yogurt. Full-fat   cheeses or blue cheese. Nondairy creamers and whipped toppings. Processed cheese, cheese spreads, or cheese  curds. Condiments Onion and garlic salt, seasoned salt, table salt, and sea salt. Canned and packaged gravies. Worcestershire sauce. Tartar sauce. Barbecue sauce. Teriyaki sauce. Soy sauce, including reduced sodium. Steak sauce. Fish sauce. Oyster sauce. Cocktail sauce. Horseradish. Ketchup and mustard. Meat flavorings and tenderizers. Bouillon cubes. Hot sauce. Tabasco sauce. Marinades. Taco seasonings. Relishes. Fats and Oils Butter, stick margarine, lard, shortening, ghee, and bacon fat. Coconut, palm kernel, or palm oils. Regular salad dressings. Other Pickles and olives. Salted popcorn and pretzels. The items listed above may not be a complete list of foods and beverages to avoid. Contact your dietitian for more information. WHERE CAN I FIND MORE INFORMATION? National Heart, Lung, and Blood Institute: www.nhlbi.nih.gov/health/health-topics/topics/dash/ Document Released: 01/05/2011 Document Revised: 06/02/2013 Document Reviewed: 11/20/2012 ExitCare Patient Information 2015 [employer]. This information is not intended to replace advice given to you by your health care provider. Make sure you discuss any questions you have with your health care provider. Hypertension Hypertension, commonly called high blood pressure, is when the force of blood pumping through your arteries is too strong. Your arteries are the blood vessels that carry blood from your heart throughout your body. A blood pressure reading consists of a higher number over a lower number, such as 110/72. The higher number (systolic) is the pressure inside your arteries when your heart pumps. The lower number (diastolic) is the pressure inside your arteries when your heart relaxes. Ideally you want your blood pressure below 120/80. Hypertension forces your heart to work harder to pump blood. Your arteries may become narrow or stiff. Having hypertension puts you at risk for heart disease, stroke, and other problems.  RISK  FACTORS Some risk factors for high blood pressure are controllable. Others are not.  Risk factors you cannot control include:   Race. You may be at higher risk if you are African American.  Age. Risk increases with age.  Gender. Men are at higher risk than women before age 45 years. After age 65, women are at higher risk than men. Risk factors you can control include:  Not getting enough exercise or physical activity.  Being overweight.  Getting too much fat, sugar, calories, or salt in your diet.  Drinking too much alcohol. SIGNS AND SYMPTOMS Hypertension does not usually cause signs or symptoms. Extremely high blood pressure (hypertensive crisis) may cause headache, anxiety, shortness of breath, and nosebleed. DIAGNOSIS  To check if you have hypertension, your health care provider will measure your blood pressure while you are seated, with your arm held at the level of your heart. It should be measured at least twice using the same arm. Certain conditions can cause a difference in blood pressure between your right and left arms. A blood pressure reading that is higher than normal on one occasion does not mean that you need treatment. If one blood pressure reading is high, ask your health care provider about having it checked again. TREATMENT  Treating high blood pressure includes making lifestyle changes and possibly taking medicine. Living a healthy lifestyle can help lower high blood pressure. You may need to change some of your habits. Lifestyle changes may include:  Following the DASH diet. This diet is high in fruits, vegetables, and whole grains. It is low in salt, red meat, and added sugars.  Getting at least 2 hours of brisk physical activity every week.  Losing weight if necessary.  Not smoking.  Limiting   alcoholic beverages.  Learning ways to reduce stress. If lifestyle changes are not enough to get your blood pressure under control, your health care provider may  prescribe medicine. You may need to take more than one. Work closely with your health care provider to understand the risks and benefits. HOME CARE INSTRUCTIONS  Have your blood pressure rechecked as directed by your health care provider.   Take medicines only as directed by your health care provider. Follow the directions carefully. Blood pressure medicines must be taken as prescribed. The medicine does not work as well when you skip doses. Skipping doses also puts you at risk for problems.   Do not smoke.   Monitor your blood pressure at home as directed by your health care provider. SEEK MEDICAL CARE IF:   You think you are having a reaction to medicines taken.  You have recurrent headaches or feel dizzy.  You have swelling in your ankles.  You have trouble with your vision. SEEK IMMEDIATE MEDICAL CARE IF:  You develop a severe headache or confusion.  You have unusual weakness, numbness, or feel faint.  You have severe chest or abdominal pain.  You vomit repeatedly.  You have trouble breathing. MAKE SURE YOU:   Understand these instructions.  Will watch your condition.  Will get help right away if you are not doing well or get worse. Document Released: 01/16/2005 Document Revised: 06/02/2013 Document Reviewed: 11/08/2012 ExitCare Patient Information 2015 [employer]. This information is not intended to replace advice given to you by your health care provider. Make sure you discuss any questions you have with your health care provider.  

## 2014-03-02 NOTE — Progress Notes (Signed)
Patient ID: Melanie Bradshaw, female   DOB: [birth year], 49 y.o.   MRN: [id number]   Melanie Bradshaw, is a 49 y.o. female  [id number]  [id number]  DOB - October 12, 1965  Chief Complaint  Patient presents with  . Follow-up        Subjective:   Melanie Bradshaw is a 49 y.o. female here today for a follow up visit. Patient has hypertension, here today for routine follow-up. She has no complaint. Blood pressure is controlled with Norvasc 10 mg tablet by mouth daily. Patient reports no side effects to medication. Her contact dermatitis has improved. She smokes marijuana but no cigarette. She drinks alcohol occasionally. Patient has No headache, No chest pain, No abdominal pain - No Nausea, No new weakness tingling or numbness, No Cough - SOB.  No problems updated.  ALLERGIES: No Known Allergies  PAST MEDICAL HISTORY: Past Medical History  Diagnosis Date  . Morbid obesity   . Hypertension   . Eczema 2010    MEDICATIONS AT HOME: Prior to Admission medications   Medication Sig Start Date End Date Taking? Authorizing Provider  amLODipine (NORVASC) 10 MG tablet Take 1 tablet (10 mg total) by mouth daily. 11/24/13  Yes Tresa Garter, MD  ciprofloxacin (CIPRO) 500 MG tablet Take 1 tablet (500 mg total) by mouth 2 (two) times daily. Patient not taking: Reported on 03/02/2014 05/19/13   Tresa Garter, MD  ketoconazole (NIZORAL) 2 % cream Apply 1 application topically daily. Patient not taking: Reported on 03/02/2014 12/18/12   Reyne Dumas, MD  metroNIDAZOLE (FLAGYL) 500 MG tablet Take 4 tablets (2,000 mg total) by mouth once. Patient not taking: Reported on 03/02/2014 03/04/13   Osborne Oman, MD  triamcinolone cream (KENALOG) 0.1 % Apply 1 application topically 2 (two) times daily. Both hands Patient not taking: Reported on 03/02/2014 02/17/13   Reyne Dumas, MD     Objective:   Filed Vitals:   03/02/14 0947  BP: 116/83  Pulse: 76  Temp: 98.2 F (36.8 C)  TempSrc: Oral  Height: 5\' 3"   (1.6 m)  Weight: 248 lb 6.4 oz (112.674 kg)  SpO2: 100%    Exam General appearance : Awake, alert, not in any distress. Speech Clear. Not toxic looking HEENT: Atraumatic and Normocephalic, pupils equally reactive to light and accomodation Neck: supple, no JVD. No cervical lymphadenopathy.  Chest:Good air entry bilaterally, no added sounds  CVS: S1 S2 regular, no murmurs.  Abdomen: Bowel sounds present, Non tender and not distended with no gaurding, rigidity or rebound. Extremities: B/L Lower Ext shows no edema, both legs are warm to touch Neurology: Awake alert, and oriented X 3, CN II-XII intact, Non focal Skin:No Rash Wounds:N/A  Data Review Lab Results  Component Value Date   HGBA1C 5.8* 12/18/2012     Assessment & Plan   1. Essential hypertension  - CBC with Differential/Platelet - COMPLETE METABOLIC PANEL WITH GFR - POCT glycosylated hemoglobin (Hb A1C) - Lipid panel - TSH - Urinalysis, Complete - Continue Norvasc 10 mg tablet by mouth daily - DASH diet  Patient was counseled extensively about nutrition and exercise Patient was counseled extensively about smoking cessation    Return in about 6 months (around 08/31/2014), or if symptoms worsen or fail to improve, for Follow up HTN.  The patient was given clear instructions to go to ER or return to medical center if symptoms don't improve, worsen or new problems develop. The patient verbalized understanding. The patient was told to call to get lab  results if they haven't heard anything in the next week.   This note has been created with Surveyor, quantity. Any transcriptional errors are unintentional.    Angelica Chessman, MD, [place], [place], [place] and [place], [place]   03/02/2014, 10:14 AM

## 2014-03-03 LAB — URINALYSIS, COMPLETE
BILIRUBIN URINE: NEGATIVE
CASTS: NONE SEEN
GLUCOSE, UA: NEGATIVE mg/dL
Hgb urine dipstick: NEGATIVE
Ketones, ur: NEGATIVE mg/dL
Leukocytes, UA: NEGATIVE
Nitrite: NEGATIVE
PH: 5 (ref 5.0–8.0)
PROTEIN: NEGATIVE mg/dL
SPECIFIC GRAVITY, URINE: 1.012 (ref 1.005–1.030)
Squamous Epithelial / LPF: NONE SEEN
Urobilinogen, UA: 0.2 mg/dL (ref 0.0–1.0)

## 2014-03-03 LAB — TSH: TSH: 1.592 u[IU]/mL (ref 0.350–4.500)

## 2014-03-06 ENCOUNTER — Telehealth: Payer: Self-pay | Admitting: Emergency Medicine

## 2014-03-06 NOTE — Telephone Encounter (Signed)
Left message for pt to call for lab results 

## 2014-03-06 NOTE — Telephone Encounter (Signed)
-----   Message from Tresa Garter, MD sent at 03/03/2014  5:31 PM EST ----- Please inform patient that her laboratory test results are within normal limits. There is evidence of uric acid crystal in the urine which may relate to stone formation, encourage patient to drink plenty of water. We will monitor subsequently. Her hemoglobin A1c shows that she is not diabetic

## 2014-03-09 ENCOUNTER — Telehealth: Payer: Self-pay | Admitting: Internal Medicine

## 2014-03-09 ENCOUNTER — Telehealth: Payer: Self-pay | Admitting: Emergency Medicine

## 2014-03-09 NOTE — Telephone Encounter (Signed)
Patient is returning phone call for nurse . Please f/u with pt.

## 2014-03-09 NOTE — Telephone Encounter (Signed)
Pt given lab results 

## 2018-12-04 ENCOUNTER — Encounter (HOSPITAL_COMMUNITY): Payer: Self-pay | Admitting: *Deleted

## 2018-12-04 ENCOUNTER — Emergency Department (HOSPITAL_COMMUNITY)
Admission: EM | Admit: 2018-12-04 | Discharge: 2018-12-04 | Disposition: A | Discharge status: Home / Self Care | Payer: Self-pay | Attending: Emergency Medicine | Admitting: Emergency Medicine

## 2018-12-04 ENCOUNTER — Other Ambulatory Visit: Payer: Self-pay

## 2018-12-04 ENCOUNTER — Emergency Department (HOSPITAL_COMMUNITY): Payer: Self-pay

## 2018-12-04 DIAGNOSIS — Z79899 Other long term (current) drug therapy: Secondary | ICD-10-CM | POA: Insufficient documentation

## 2018-12-04 DIAGNOSIS — I10 Essential (primary) hypertension: Secondary | ICD-10-CM | POA: Insufficient documentation

## 2018-12-04 DIAGNOSIS — R0789 Other chest pain: Secondary | ICD-10-CM | POA: Insufficient documentation

## 2018-12-04 LAB — CBC
HCT: 37.4 % (ref 36.0–46.0)
Hemoglobin: 13 g/dL (ref 12.0–15.0)
MCH: 32.7 pg (ref 26.0–34.0)
MCHC: 34.8 g/dL (ref 30.0–36.0)
MCV: 94.2 fL (ref 80.0–100.0)
Platelets: 217 10*3/uL (ref 150–400)
RBC: 3.97 MIL/uL (ref 3.87–5.11)
RDW: 14.6 % (ref 11.5–15.5)
WBC: 4.1 10*3/uL (ref 4.0–10.5)
nRBC: 0 % (ref 0.0–0.2)

## 2018-12-04 LAB — BASIC METABOLIC PANEL
Anion gap: 12 (ref 5–15)
BUN: 15 mg/dL (ref 6–20)
CO2: 23 mmol/L (ref 22–32)
Calcium: 9.4 mg/dL (ref 8.9–10.3)
Chloride: 101 mmol/L (ref 98–111)
Creatinine, Ser: 0.94 mg/dL (ref 0.44–1.00)
GFR calc Af Amer: >60 mL/min (ref >60)
GFR calc non Af Amer: >60 mL/min (ref >60)
Glucose, Bld: 89 mg/dL (ref 70–99)
Potassium: 4.4 mmol/L (ref 3.5–5.1)
Sodium: 136 mmol/L (ref 135–145)

## 2018-12-04 LAB — I-STAT BETA HCG BLOOD, ED (MC, WL, AP ONLY): I-stat hCG, quantitative: <5 m[IU]/mL (ref <5)

## 2018-12-04 LAB — TROPONIN I (HIGH SENSITIVITY): Troponin I (High Sensitivity): 3 ng/L (ref <18)

## 2018-12-04 MED ORDER — SODIUM CHLORIDE 0.9% FLUSH: 3.0000 mL | Freq: Once | INTRAVENOUS | Status: DC

## 2018-12-04 MED ORDER — HYDROCODONE-ACETAMINOPHEN 5-325 MG PO TABS
1.0000 | ORAL_TABLET | Freq: Once | ORAL | Status: AC
  Administered 2018-12-04: 14:00:00 1 via ORAL
  Filled 2018-12-04: qty 1

## 2018-12-04 MED ORDER — IBUPROFEN 400 MG PO TABS
400.0000 mg | ORAL_TABLET | Freq: Once | ORAL | Status: AC
  Administered 2018-12-04: 14:00:00 400 mg via ORAL
  Filled 2018-12-04: qty 1

## 2018-12-04 NOTE — ED Triage Notes (Signed)
Pt reports right side chest pain that started today, increases when moving her right arm. Denies sob.

## 2018-12-07 NOTE — ED Provider Notes (Signed)
[facility] EMERGENCY DEPARTMENT Provider Note   CSN: [id number] Arrival date & time: 12/04/18  1024     History   Chief Complaint Chief Complaint  Patient presents with  . Chest Pain    HPI Melanie Bradshaw is a 53 y.o. female.     HPI   52 year old female with right upper chest pain.  Onset earlier today.  Pain is in the right upper chest towards the shoulder.  Worse with movement.  She noticed it when she was preparing food/stirring something.  Pain is worse with movement.  No respiratory complaints.  No pain with completely still.  No diaphoresis or nausea.  Past Medical History:  Diagnosis Date  . Eczema 2010  . Hypertension   . Morbid obesity [facility])     Patient Active Problem List   Diagnosis Date Noted  . Essential hypertension 11/24/2013  . UTI (urinary tract infection) 05/19/2013  . Contact dermatitis 05/19/2013  . HTN (hypertension) 05/19/2013    Past Surgical History:  Procedure Laterality Date  . CESAREAN SECTION       OB History   No obstetric history on file.      Home Medications    Prior to Admission medications   Medication Sig Start Date End Date Taking? Authorizing Provider  amLODipine (NORVASC) 10 MG tablet Take 1 tablet (10 mg total) by mouth daily. 11/24/13   Tresa Garter, MD  ciprofloxacin (CIPRO) 500 MG tablet Take 1 tablet (500 mg total) by mouth 2 (two) times daily. Patient not taking: Reported on 03/02/2014 05/19/13   Tresa Garter, MD  ketoconazole (NIZORAL) 2 % cream Apply 1 application topically daily. Patient not taking: Reported on 03/02/2014 12/18/12   Reyne Dumas, MD  metroNIDAZOLE (FLAGYL) 500 MG tablet Take 4 tablets (2,000 mg total) by mouth once. Patient not taking: Reported on 03/02/2014 03/04/13   Osborne Oman, MD  triamcinolone cream (KENALOG) 0.1 % Apply 1 application topically 2 (two) times daily. Both hands Patient not taking: Reported on 03/02/2014 02/17/13   Reyne Dumas, MD    Family  History Family History  Problem Relation Age of Onset  . Diabetes Mother   . Hypertension Mother   . Diabetes Sister   . Diabetes Brother     Social History Social History   Tobacco Use  . Smoking status: Never Smoker  Substance Use Topics  . Alcohol use: No  . Drug use: Yes    Types: Marijuana     Allergies   Patient has no known allergies.   Review of Systems Review of Systems All systems reviewed and negative, other than as noted in HPI.   Physical Exam Updated Vital Signs BP 127/87   Pulse 61   Temp 98.2 F (36.8 C) (Oral)   Resp 16   SpO2 100%   Physical Exam Vitals signs and nursing note reviewed.  Constitutional:      General: She is not in acute distress.    Appearance: She is well-developed.  HENT:     Head: Normocephalic and atraumatic.  Eyes:     General:        Right eye: No discharge.        Left eye: No discharge.     Conjunctiva/sclera: Conjunctivae normal.  Neck:     Musculoskeletal: Neck supple.  Cardiovascular:     Rate and Rhythm: Normal rate and regular rhythm.     Heart sounds: Normal heart sounds. No murmur. No friction rub. No gallop.  Comments: Tenderness to palpation in pictured area.  No overlying skin changes.  No crepitus.  Breath sounds clear bilaterally. Pulmonary:     Effort: Pulmonary effort is normal. No respiratory distress.     Breath sounds: Normal breath sounds.  Chest:     Chest wall: Tenderness present.    Abdominal:     General: There is no distension.     Palpations: Abdomen is soft.     Tenderness: There is no abdominal tenderness.  Musculoskeletal:        General: No tenderness.  Skin:    General: Skin is warm and dry.  Neurological:     Mental Status: She is alert.  Psychiatric:        Behavior: Behavior normal.        Thought Content: Thought content normal.      ED Treatments / Results  Labs (all labs ordered are listed, but only abnormal results are displayed) Labs Reviewed  BASIC  METABOLIC PANEL  CBC  I-STAT BETA HCG BLOOD, ED ([facility], [facility], [facility] ONLY)  TROPONIN I (HIGH SENSITIVITY)    EKG EKG Interpretation  Date/Time:  Wednesday December 04 2018 10:57:12 EST Ventricular Rate:  64 PR Interval:  166 QRS Duration: 78 QT Interval:  420 QTC Calculation: 433 R Axis:   11 Text Interpretation: Normal sinus rhythm Cannot rule out Anterior infarct , age undetermined Abnormal ECG Confirmed by Virgel Manifold [phone]) on 12/04/2018 3:40:56 PM   Radiology No results found.  Procedures Procedures (including critical care time)  Medications Ordered in ED Medications  ibuprofen (ADVIL) tablet 400 mg (400 mg Oral Given 12/04/18 1422)  HYDROcodone-acetaminophen (NORCO/VICODIN) 5-325 MG per tablet 1 tablet (1 tablet Oral Given 12/04/18 1422)     Initial Impression / Assessment and Plan / ED Course  I have reviewed the triage vital signs and the nursing notes.  Pertinent labs & imaging results that were available during my care of the patient were reviewed by me and considered in my medical decision making (see chart for details).       53 year old female with chest pain.  This seems most consistent with chest wall pain.  Onset while stirring.  Reproducible with palpation and movement.  She is very typical for ACS.  Doubt PE, dissection or other emergent process.  Plan symptomatic treatment.  Return precautions were discussed.  Outpatient follow-up as needed otherwise.  Final Clinical Impressions(s) / ED Diagnoses   Final diagnoses:  Chest wall pain    ED Discharge Orders    None       Virgel Manifold, MD 12/07/18 1118

## 2019-05-31 ENCOUNTER — Emergency Department (HOSPITAL_COMMUNITY)
Admission: EM | Admit: 2019-05-31 | Discharge: 2019-05-31 | Disposition: A | Discharge status: Home / Self Care | Payer: Self-pay | Attending: Emergency Medicine | Admitting: Emergency Medicine

## 2019-05-31 ENCOUNTER — Other Ambulatory Visit: Payer: Self-pay

## 2019-05-31 ENCOUNTER — Encounter (HOSPITAL_COMMUNITY): Payer: Self-pay | Admitting: Emergency Medicine

## 2019-05-31 DIAGNOSIS — I10 Essential (primary) hypertension: Secondary | ICD-10-CM | POA: Insufficient documentation

## 2019-05-31 DIAGNOSIS — Z79899 Other long term (current) drug therapy: Secondary | ICD-10-CM | POA: Insufficient documentation

## 2019-05-31 DIAGNOSIS — R21 Rash and other nonspecific skin eruption: Secondary | ICD-10-CM | POA: Insufficient documentation

## 2019-05-31 LAB — RPR: RPR Ser Ql: NONREACTIVE

## 2019-05-31 LAB — HIV ANTIBODY (ROUTINE TESTING W REFLEX): HIV Screen 4th Generation wRfx: NONREACTIVE

## 2019-05-31 MED ORDER — PREDNISONE 10 MG (21) PO TBPK: ORAL_TABLET | ORAL | 0 refills | Status: DC

## 2019-05-31 MED ORDER — EUCERIN EX CREA: TOPICAL_CREAM | CUTANEOUS | 0 refills | Status: DC | PRN

## 2019-05-31 NOTE — Discharge Instructions (Addendum)
This rash has the appearance of eczema. The key for management is moisturization of the skin and avoidance of triggers. Moisturization: May try applying thick, moisturizing lotion such as Eucerin, or sometimes an even more effective option may be applying an ointment, such as Aquaphor.  Although sometimes ointments are not tolerated due to their thickness, many times they are more effective. Apply 1 of these twice daily, but certainly right after bath, before bed.  After bedtime application, place the patient in pajamas and mittens to hold in the moisturizing effect. Move to a bathing every other day schedule, as practical. Triggers: Avoid possible triggers.  These can be medications, types of fabric, dyes, soaps, detergents, and even foods.  Some unavoidable factors are weather and environmental components.  Prednisone: Take the prednisone, as prescribed, until finished. If you are a diabetic, please know prednisone can raise your blood sugar temporarily.  Prednisone works best if the entire course is finished.  As a precaution, lab work was drawn to test for possible other causes of the rash.  These causes are rare, however, if either of these lab tests return positive, you will need to return to the emergency department.  Typically, if these lab tests are positive someone from the hospital will call you.  Follow-up: We recommend follow-up with, at the very least, a primary care provider.  Call the number provided to set up an appointment. Ideally, you would also follow-up with a dermatologist.  Please call some of these practices, tell them you were seen in the emergency department, and were recommended for dermatology follow-up.

## 2019-05-31 NOTE — ED Provider Notes (Signed)
[facility] EMERGENCY DEPARTMENT Provider Note   CSN: [id number] Arrival date & time: 05/31/19  [id number]     History Chief Complaint  Patient presents with  . Rash    Melanie Bradshaw is a 54 y.o. female.  HPI      Melanie Bradshaw is a 54 y.o. female, with a history of eczema, HTN, presenting to the ED with rash that has been recurrent for several years.  She states she will have this rash recur on her hands, neck, face, and chest.  The current manifestation has been present for a few months. She states in the past she has used Vaseline and Benadryl cream, but this manifestation seems to be different.  She has noted itching and burning that will not go away.  She has also noted lesions on her palms that she has not noticed before. She states she has not been assessed for this because she cannot afford to see a doctor. She denies fever/chills, mouth lesions, ear pain/drainage, vision changes, confusion, neurologic deficits, discharge, genital lesions, arthralgias, or any other complaints.  Past Medical History:  Diagnosis Date  . Eczema 2010  . Hypertension   . Morbid obesity [facility])     Patient Active Problem List   Diagnosis Date Noted  . Essential hypertension 11/24/2013  . UTI (urinary tract infection) 05/19/2013  . Contact dermatitis 05/19/2013  . HTN (hypertension) 05/19/2013    Past Surgical History:  Procedure Laterality Date  . CESAREAN SECTION       OB History   No obstetric history on file.     Family History  Problem Relation Age of Onset  . Diabetes Mother   . Hypertension Mother   . Diabetes Sister   . Diabetes Brother     Social History   Tobacco Use  . Smoking status: Never Smoker  Substance Use Topics  . Alcohol use: No  . Drug use: Yes    Types: Marijuana    Home Medications Prior to Admission medications   Medication Sig Start Date End Date Taking? Authorizing Provider  amLODipine (NORVASC) 10 MG tablet Take 1 tablet (10 mg  total) by mouth daily. 11/24/13   Tresa Garter, MD  ciprofloxacin (CIPRO) 500 MG tablet Take 1 tablet (500 mg total) by mouth 2 (two) times daily. Patient not taking: Reported on 03/02/2014 05/19/13   Tresa Garter, MD  ketoconazole (NIZORAL) 2 % cream Apply 1 application topically daily. Patient not taking: Reported on 03/02/2014 12/18/12   Reyne Dumas, MD  metroNIDAZOLE (FLAGYL) 500 MG tablet Take 4 tablets (2,000 mg total) by mouth once. Patient not taking: Reported on 03/02/2014 03/04/13   Anyanwu, Sallyanne Havers, MD  predniSONE (STERAPRED UNI-PAK 21 TAB) 10 MG (21) TBPK tablet Take 6 tabs by mouth daily  for 2 days, then 5 tabs for 2 days, then 4 tabs for 2 days, then 3 tabs for 2 days, 2 tabs for 2 days, then 1 tab by mouth daily for 2 days 05/31/19   Lorayne Bender, PA-C  Skin Protectants, Misc. (EUCERIN) cream Apply topically as needed for dry skin. 05/31/19   Tawona Filsinger C, PA-C  triamcinolone cream (KENALOG) 0.1 % Apply 1 application topically 2 (two) times daily. Both hands Patient not taking: Reported on 03/02/2014 02/17/13   Reyne Dumas, MD    Allergies    Patient has no known allergies.  Review of Systems   Review of Systems  Constitutional: Negative for chills and fever.  HENT: Negative  for ear discharge, ear pain, hearing loss and trouble swallowing.   Eyes: Negative for visual disturbance.  Gastrointestinal: Negative for nausea and vomiting.  Musculoskeletal: Negative for neck pain and neck stiffness.  Skin: Positive for rash.    Physical Exam Updated Vital Signs BP 134/89 (BP Location: Left Arm)   Pulse 60   Temp 98.4 F (36.9 C) (Oral)   Resp 18   Ht 5\' 4"  ([id number] m)   Wt 98.4 kg   SpO2 100%   BMI 37.25 kg/m   Physical Exam Vitals and nursing note reviewed.  Constitutional:      General: She is not in acute distress.    Appearance: She is well-developed. She is not diaphoretic.  HENT:     Head: Normocephalic and atraumatic.     Right Ear: Tympanic membrane,  ear canal and external ear normal.     Left Ear: Tympanic membrane, ear canal and external ear normal.     Ears:     Comments: No noted lesions in the ear canals.    Nose: Nose normal.     Mouth/Throat:     Mouth: Mucous membranes are moist.     Pharynx: Oropharynx is clear.     Comments: No noted intraoral lesions or swelling.  Handles oral secretions without difficulty.  No noted phonation abnormalities. Eyes:     Extraocular Movements: Extraocular movements intact.     Pupils: Pupils are equal, round, and reactive to light.     Comments: No noted periorbital lesions.  She does have some conjunctival injection, but denies any pain or discharge.  Cardiovascular:     Rate and Rhythm: Normal rate and regular rhythm.     Pulses: Normal pulses.  Pulmonary:     Effort: Pulmonary effort is normal.  Musculoskeletal:     Cervical back: Normal range of motion and neck supple. No tenderness.  Lymphadenopathy:     Cervical: No cervical adenopathy.  Skin:    General: Skin is warm and dry.     Capillary Refill: Capillary refill takes less than 2 seconds.     Coloration: Skin is not pale.     Findings: Rash present.     Comments: Confluent, hyperpigmented lesions with apparent thickened skin across the dorsal hands and distal forearms.  No noted lesions on the flexor or extensor surfaces of the elbows. There are some areas with apparent excoriation presumably from the patient's itching of the region.  No erythema, purulence, significant tenderness, or other indication of superinfection. She has thickened skin to the neck and right side of the face. She does have some apparent dark, scattered lesions to the palms and a few on the soles.   Neurological:     Mental Status: She is alert and oriented to person, place, and time.     Comments: No noted acute cognitive deficit. Sensation grossly intact to light touch in the extremities.   Grip strengths equal bilaterally.   Strength 5/5 in all  extremities.  No gait disturbance.  Coordination intact.  Cranial nerves III-XII grossly intact.  Handles oral secretions without noted difficulty.  No noted phonation or speech deficit. No facial droop.   Psychiatric:        Mood and Affect: Mood normal.        Behavior: Behavior normal.            ED Results / Procedures / Treatments   Labs (all labs ordered are listed, but only abnormal results are displayed) Labs  Reviewed  RPR  HIV ANTIBODY (ROUTINE TESTING W REFLEX)    EKG None  Radiology No results found.  Procedures Procedures (including critical care time)  Medications Ordered in ED Medications - No data to display  ED Course  I have reviewed the triage vital signs and the nursing notes.  Pertinent labs & imaging results that were available during my care of the patient were reviewed by me and considered in my medical decision making (see chart for details).    MDM Rules/Calculators/A&P                      Patient presents with a rash recurrent over the last few years.  It does seem to have some features suggestive of eczema or similar issue, however, due to the lesions on her palms, RPR was included in the patient's work-up.  This will not return while the patient is in the ED. I also put in a request for social work/case management to assist the patient in securing PCP follow-up.  I discussed steps patient can take on her own to help secure this follow-up.  She was given information for contacting dermatologist as well.  The patient was given instructions for home care as well as return precautions. Patient voices understanding of these instructions, accepts the plan, and is comfortable with discharge.    Final Clinical Impression(s) / ED Diagnoses Final diagnoses:  Rash    Rx / DC Orders ED Discharge Orders         Ordered    predniSONE (STERAPRED UNI-PAK 21 TAB) 10 MG (21) TBPK tablet     05/31/19 0918    Skin Protectants, Misc. (EUCERIN)  cream  As needed     05/31/19 Carlisle, Kalliopi Coupland C, PA-C 05/31/19 [id number]    Pattricia Boss, MD 05/31/19 1401

## 2019-05-31 NOTE — ED Triage Notes (Signed)
C/o itching, burning, and swelling to bilateral hands, face, and neck x 1 year.

## 2019-06-02 ENCOUNTER — Telehealth: Payer: Self-pay

## 2019-06-02 NOTE — Progress Notes (Signed)
Patient called back asking if we could help make appointment. Melanie Bradshaw Jun 20, 2019 at 0850. Called patient with date , time and place

## 2019-06-02 NOTE — Telephone Encounter (Signed)
Called patient to see if she had made appointment with [facility]. No answer. Left message to return my call.

## 2019-06-03 NOTE — Telephone Encounter (Signed)
Patient notified of appt. 

## 2019-06-20 ENCOUNTER — Ambulatory Visit (INDEPENDENT_AMBULATORY_CARE_PROVIDER_SITE_OTHER): Payer: Self-pay | Admitting: Primary Care

## 2019-06-20 ENCOUNTER — Other Ambulatory Visit: Payer: Self-pay

## 2019-06-20 ENCOUNTER — Encounter (INDEPENDENT_AMBULATORY_CARE_PROVIDER_SITE_OTHER): Payer: Self-pay | Admitting: Primary Care

## 2019-06-20 VITALS — BP 127/86 | HR 64 | Temp 97.3°F | Wt 213.8 lb

## 2019-06-20 DIAGNOSIS — L989 Disorder of the skin and subcutaneous tissue, unspecified: Secondary | ICD-10-CM

## 2019-06-20 DIAGNOSIS — Z09 Encounter for follow-up examination after completed treatment for conditions other than malignant neoplasm: Secondary | ICD-10-CM

## 2019-06-20 DIAGNOSIS — Z23 Encounter for immunization: Secondary | ICD-10-CM

## 2019-06-20 DIAGNOSIS — Z131 Encounter for screening for diabetes mellitus: Secondary | ICD-10-CM

## 2019-06-20 DIAGNOSIS — Z7689 Persons encountering health services in other specified circumstances: Secondary | ICD-10-CM

## 2019-06-20 LAB — POCT GLYCOSYLATED HEMOGLOBIN (HGB A1C): Hemoglobin A1C: 5.3 % (ref 4.0–5.6)

## 2019-06-20 MED ORDER — PREDNISONE 20 MG PO TABS: 20.0000 mg | ORAL_TABLET | Freq: Every day | ORAL | 0 refills | Status: DC

## 2019-06-20 NOTE — Patient Instructions (Signed)

## 2019-06-20 NOTE — Progress Notes (Signed)
New Patient Office Visit  Subjective:  Patient ID: Melanie Bradshaw, female    DOB: 11/23/1965  Age: 54 y.o. MRN: [id number]  CC:  Chief Complaint  Patient presents with  . New Patient (Initial Visit)    Eczema    HPI Melanie Bradshaw is a [age] year old [age] female presents today to establish care and emergency room on 05/31/2019 for She does not like doctors and is only concerned with the eczema which itches her all over. She admits to drinking 1-2 beers daily.  Past Medical History:  Diagnosis Date  . Eczema 2010  . Hypertension   . Morbid obesity ([place])     Past Surgical History:  Procedure Laterality Date  . CESAREAN SECTION      Family History  Problem Relation Age of Onset  . Diabetes Mother   . Hypertension Mother   . Diabetes Sister   . Diabetes Brother     Social History   Socioeconomic History  . Marital status: Single    Spouse name: Not on file  . Number of children: Not on file  . Years of education: Not on file  . Highest education level: Not on file  Occupational History  . Not on file  Tobacco Use  . Smoking status: Never Smoker  Substance and Sexual Activity  . Alcohol use: No  . Drug use: Yes    Types: Marijuana  . Sexual activity: Not on file  Other Topics Concern  . Not on file  Social History Narrative  . Not on file   Social Determinants of Health   Financial Resource Strain:   . Difficulty of Paying Living Expenses:   Food Insecurity:   . Worried About Charity fundraiser in the Last Year:   . Arboriculturist in the Last Year:   Transportation Needs:   . Film/video editor (Medical):   Marland Kitchen Lack of Transportation (Non-Medical):   Physical Activity:   . Days of Exercise per Week:   . Minutes of Exercise per Session:   Stress:   . Feeling of Stress :   Social Connections:   . Frequency of Communication with Friends and Family:   . Frequency of Social Gatherings with Friends and Family:   . Attends Religious Services:   .  Active Member of Clubs or Organizations:   . Attends Archivist Meetings:   Marland Kitchen Marital Status:   Intimate Partner Violence:   . Fear of Current or Ex-Partner:   . Emotionally Abused:   Marland Kitchen Physically Abused:   . Sexually Abused:     ROS Review of Systems  Skin: Positive for rash.  All other systems reviewed and are negative.   Objective:   Today's Vitals: BP 127/86 (BP Location: Right Arm, Patient Position: Sitting, Cuff Size: Large)   Pulse 64   Temp (!) 97.3 F (36.3 C) (Temporal)   Wt 213 lb 12.8 oz (97 kg)   SpO2 97%   BMI 36.70 kg/m   Physical Exam Vitals reviewed.  Constitutional:      Appearance: She is obese.  HENT:     Head: Normocephalic.     Right Ear: Tympanic membrane normal.     Left Ear: Tympanic membrane normal.  Eyes:     Extraocular Movements: Extraocular movements intact.     Pupils: Pupils are equal, round, and reactive to light.  Cardiovascular:     Rate and Rhythm: Normal rate and regular rhythm.  Pulmonary:  Effort: Pulmonary effort is normal.     Breath sounds: Normal breath sounds.  Abdominal:     General: Bowel sounds are normal.  Musculoskeletal:        General: Normal range of motion.     Cervical back: Normal range of motion.  Skin:    General: Skin is warm and dry.     Comments: Confluent, hyperpigmented lesions with apparent thickened skin across the dorsal hands and distal forearms.  No noted lesions on the flexor or extensor surfaces of the elbows. There are some areas with apparent excoriation presumably from the patient's itching of the region.  No erythema, purulence, significant tenderness, or other indication of superinfection. She has thickened skin to the neck and right side of the face. She does have some apparent dark, scattered lesions to the palms and a few on the soles.    Neurological:     Mental Status: She is alert and oriented to person, place, and time.  Psychiatric:        Mood and Affect: Mood  normal.        Behavior: Behavior normal.        Thought Content: Thought content normal.        Judgment: Judgment normal.     Assessment & Plan:  Melanie Bradshaw was seen today for new patient (initial visit).  Diagnoses and all orders for this visit:  Need for Tdap vaccination -     Tdap vaccine greater than or equal to 7yo IM  Screening for diabetes mellitus -     HgB A1c 5.3   Encounter to establish care Juluis Mire, NP-C will be your  (PCP) she is mastered prepared . She is skilled to diagnosed and treat illness. Also able to answer health concern as well as continuing care of varied medical conditions, not limited by cause, organ system, or diagnosis.    Eczematous skin lesions       Appointment schedule with Dermatologist on July 08, 2019 . Will place on Prednisone 20 mg daily until seen.   Hospital discharge follow-up Presented 05/31/2019 to ED with rash that has been recurrent for several years.  This rash recur on her hands, neck, face, and chest.  Has been getting worst over the last few months . Recommended-Follow up with Specialists, Dermatology and establish care with PCP  . Outpatient Encounter Medications as of 06/20/2019  Medication Sig  . predniSONE (STERAPRED UNI-PAK 21 TAB) 10 MG (21) TBPK tablet Take 6 tabs by mouth daily  for 2 days, then 5 tabs for 2 days, then 4 tabs for 2 days, then 3 tabs for 2 days, 2 tabs for 2 days, then 1 tab by mouth daily for 2 days  . Skin Protectants, Misc. (EUCERIN) cream Apply topically as needed for dry skin.  Marland Kitchen amLODipine (NORVASC) 10 MG tablet Take 1 tablet (10 mg total) by mouth daily. (Patient not taking: Reported on 06/20/2019)  . predniSONE (DELTASONE) 20 MG tablet Take 1 tablet (20 mg total) by mouth daily with breakfast.  . [DISCONTINUED] ciprofloxacin (CIPRO) 500 MG tablet Take 1 tablet (500 mg total) by mouth 2 (two) times daily. (Patient not taking: Reported on 03/02/2014)  . [DISCONTINUED] ketoconazole (NIZORAL) 2 %  cream Apply 1 application topically daily. (Patient not taking: Reported on 03/02/2014)  . [DISCONTINUED] metroNIDAZOLE (FLAGYL) 500 MG tablet Take 4 tablets (2,000 mg total) by mouth once. (Patient not taking: Reported on 03/02/2014)  . [DISCONTINUED] triamcinolone cream (KENALOG) 0.1 % Apply 1  application topically 2 (two) times daily. Both hands (Patient not taking: Reported on 03/02/2014)   No facility-administered encounter medications on file as of 06/20/2019.    Follow-up: Return if symptoms worsen or fail to improve.   [name], NP

## 2019-11-17 ENCOUNTER — Telehealth (INDEPENDENT_AMBULATORY_CARE_PROVIDER_SITE_OTHER): Payer: Self-pay | Admitting: Primary Care

## 2019-11-17 NOTE — Telephone Encounter (Signed)
Sent to PCP to refill if appropriate.  

## 2019-11-17 NOTE — Telephone Encounter (Signed)
Patient presented to the clinic to make an appointment and request a refill for medication that helps her itchy skin. Patient is scheduled for next Tuesday and wondered if she could have the medication refilled prior to this appointment. States that the medication really helped and that the itching is terrible.

## 2019-11-18 ENCOUNTER — Other Ambulatory Visit: Payer: Self-pay | Admitting: Primary Care

## 2019-11-18 DIAGNOSIS — L249 Irritant contact dermatitis, unspecified cause: Secondary | ICD-10-CM

## 2019-11-18 MED ORDER — EUCERIN EX CREA: TOPICAL_CREAM | CUTANEOUS | 0 refills | Status: DC | PRN

## 2019-11-18 NOTE — Telephone Encounter (Signed)
Please address request.

## 2019-11-18 NOTE — Telephone Encounter (Signed)
Unfortunately only thing I see is Eucerin lotion and prednisone.  Will not prescribe prednisone without appt . I will send in Eucerin

## 2019-11-25 ENCOUNTER — Ambulatory Visit (INDEPENDENT_AMBULATORY_CARE_PROVIDER_SITE_OTHER): Payer: Commercial Managed Care - PPO | Admitting: Primary Care

## 2019-11-25 ENCOUNTER — Other Ambulatory Visit: Payer: Self-pay

## 2019-11-25 ENCOUNTER — Encounter (INDEPENDENT_AMBULATORY_CARE_PROVIDER_SITE_OTHER): Payer: Self-pay | Admitting: Primary Care

## 2019-11-25 VITALS — BP 120/83 | HR 70 | Temp 97.5°F | Ht 64.0 in | Wt 211.0 lb

## 2019-11-25 DIAGNOSIS — Z23 Encounter for immunization: Secondary | ICD-10-CM | POA: Diagnosis not present

## 2019-11-25 DIAGNOSIS — L249 Irritant contact dermatitis, unspecified cause: Secondary | ICD-10-CM | POA: Diagnosis not present

## 2019-11-25 DIAGNOSIS — Z13220 Encounter for screening for lipoid disorders: Secondary | ICD-10-CM

## 2019-11-25 DIAGNOSIS — Z1211 Encounter for screening for malignant neoplasm of colon: Secondary | ICD-10-CM | POA: Diagnosis not present

## 2019-11-25 DIAGNOSIS — Z1231 Encounter for screening mammogram for malignant neoplasm of breast: Secondary | ICD-10-CM | POA: Diagnosis not present

## 2019-11-25 MED ORDER — EUCERIN EX CREA: TOPICAL_CREAM | CUTANEOUS | 0 refills | Status: DC | PRN

## 2019-11-25 MED ORDER — PREDNISONE 20 MG PO TABS: 20.0000 mg | ORAL_TABLET | Freq: Every day | ORAL | 0 refills | Status: DC

## 2019-11-25 NOTE — Progress Notes (Signed)
Established Patient Office Visit  Subjective:  Patient ID: Melanie Bradshaw, female    DOB: Nov 10, 1965  Age: 54 y.o. MRN: [id number]  CC:  Chief Complaint  Patient presents with   itchiness   Medication Refill    prednisone    HPI Ms Chirstine Bradshaw presents is a 54 year old female that presents for pruritus and requesting medication refill.  Patient does have an appointment with dermatology but not until December 22, 1999  Past Medical History:  Diagnosis Date   Eczema 2010   Hypertension    Morbid obesity ([place])     Past Surgical History:  Procedure Laterality Date   CESAREAN SECTION      Family History  Problem Relation Age of Onset   Diabetes Mother    Hypertension Mother    Diabetes Sister    Diabetes Brother     Social History   Socioeconomic History   Marital status: Single    Spouse name: Not on file   Number of children: Not on file   Years of education: Not on file   Highest education level: Not on file  Occupational History   Not on file  Tobacco Use   Smoking status: Never Smoker   Smokeless tobacco: Never Used  Substance and Sexual Activity   Alcohol use: No   Drug use: Yes    Types: Marijuana   Sexual activity: Not Currently  Other Topics Concern   Not on file  Social History Narrative   Not on file   Social Determinants of Health   Financial Resource Strain:    Difficulty of Paying Living Expenses: Not on file  Food Insecurity:    Worried About [place] in the Last Year: Not on file   Ran Out of Food in the Last Year: Not on file  Transportation Needs:    Lack of Transportation (Medical): Not on file   Lack of Transportation (Non-Medical): Not on file  Physical Activity:    Days of Exercise per Week: Not on file   Minutes of Exercise per Session: Not on file  Stress:    Feeling of Stress : Not on file  Social Connections:    Frequency of Communication with Friends and Family: Not on file    Frequency of Social Gatherings with Friends and Family: Not on file   Attends Religious Services: Not on file   Active Member of Clubs or Organizations: Not on file   Attends Archivist Meetings: Not on file   Marital Status: Not on file  Intimate Partner Violence:    Fear of Current or Ex-Partner: Not on file   Emotionally Abused: Not on file   Physically Abused: Not on file   Sexually Abused: Not on file    Outpatient Medications Prior to Visit  Medication Sig Dispense Refill   amLODipine (NORVASC) 10 MG tablet Take 1 tablet (10 mg total) by mouth daily. (Patient not taking: Reported on 06/20/2019) 90 tablet 3   predniSONE (DELTASONE) 20 MG tablet Take 1 tablet (20 mg total) by mouth daily with breakfast. 30 tablet 0   predniSONE (STERAPRED UNI-PAK 21 TAB) 10 MG (21) TBPK tablet Take 6 tabs by mouth daily  for 2 days, then 5 tabs for 2 days, then 4 tabs for 2 days, then 3 tabs for 2 days, 2 tabs for 2 days, then 1 tab by mouth daily for 2 days 42 tablet 0   Skin Protectants, Misc. (EUCERIN) cream Apply topically  as needed for dry skin. 454 g 0   No facility-administered medications prior to visit.    Allergies  Allergen Reactions   Tomato     ROS Review of Systems  Skin: Positive for rash.  All other systems reviewed and are negative.     Objective:    Physical Exam Vitals reviewed.  Constitutional:      Appearance: She is obese.  HENT:     Head: Normocephalic.     Right Ear: Tympanic membrane normal.     Left Ear: Tympanic membrane normal.     Nose: Nose normal.  Cardiovascular:     Rate and Rhythm: Normal rate and regular rhythm.  Pulmonary:     Effort: Pulmonary effort is normal.     Breath sounds: Normal breath sounds.  Abdominal:     General: Bowel sounds are normal. There is distension.     Palpations: Abdomen is soft.  Musculoskeletal:        General: Normal range of motion.     Cervical back: Normal range of motion and neck  supple.  Skin:    General: Skin is warm and dry.  Neurological:     Mental Status: She is alert and oriented to person, place, and time.  Psychiatric:        Mood and Affect: Mood normal.        Behavior: Behavior normal.        Thought Content: Thought content normal.        Judgment: Judgment normal.     BP 120/83 (BP Location: Right Arm, Patient Position: Sitting, Cuff Size: Normal)    Pulse 70    Temp (!) 97.5 F (36.4 C) (Temporal)    Ht 5' 4"  (1.626 m)    Wt 211 lb (95.7 kg)    SpO2 97%    BMI 36.22 kg/m  Wt Readings from Last 3 Encounters:  11/25/19 211 lb (95.7 kg)  06/20/19 213 lb 12.8 oz (97 kg)  05/31/19 217 lb (98.4 kg)     Health Maintenance Due  Topic Date Due   PNEUMOCOCCAL POLYSACCHARIDE VACCINE AGE 53-64 HIGH RISK  Never done   FOOT EXAM  Never done   OPHTHALMOLOGY EXAM  Never done   URINE MICROALBUMIN  Never done   MAMMOGRAM  06/22/2015   COLONOSCOPY  Never done   PAP SMEAR-Modifier  02/27/2016    There are no preventive care reminders to display for this patient.  Lab Results  Component Value Date   TSH 1.592 03/02/2014   Lab Results  Component Value Date   WBC 5.6 11/25/2019   HGB 12.8 11/25/2019   HCT 38.3 11/25/2019   MCV 101 (H) 11/25/2019   PLT 221 11/25/2019   Lab Results  Component Value Date   NA 137 11/25/2019   K 4.3 11/25/2019   CO2 22 11/25/2019   GLUCOSE 93 11/25/2019   BUN 14 11/25/2019   CREATININE 0.76 11/25/2019   BILITOT 0.4 11/25/2019   ALKPHOS 115 11/25/2019   AST 26 11/25/2019   ALT 18 11/25/2019   PROT 7.2 11/25/2019   ALBUMIN 3.7 (L) 11/25/2019   CALCIUM 9.2 11/25/2019   ANIONGAP 12 12/04/2018   Lab Results  Component Value Date   CHOL 182 03/02/2014   Lab Results  Component Value Date   HDL 89 03/02/2014   Lab Results  Component Value Date   LDLCALC 82 03/02/2014   Lab Results  Component Value Date   TRIG 53 03/02/2014  Lab Results  Component Value Date   CHOLHDL 2.0 03/02/2014   Lab  Results  Component Value Date   HGBA1C 5.3 06/20/2019      Assessment & Plan:  Audianna was seen today for itchiness and medication refill.  Diagnoses and all orders for this visit:  Screening, lipid Increased risk for cardiovascular disease Body mass index is 36.22 kg/m. -     CBC with Differential -     CMP14+EGFR  Irritant contact dermatitis, unspecified trigger Discussed irritants that can be causing rash she denied changing of soaps, deodorants, perfumes, than she maintained her daughter has the same rash and is taking her the doctor next week.  She remembered she changed her Gain detergent to Tide.  Advised patient to go back to again.  Treat with prednisone continue use If this resolves she can decide if she still wants to follow-up with dermatology -     predniSONE (DELTASONE) 20 MG tablet; Take 1 tablet (20 mg total) by mouth daily with breakfast. -     Skin Protectants, Misc. (EUCERIN) cream; Apply topically as needed for dry skin. -     CBC with Differential -     CMP14+EGFR  Colon cancer screening -     Ambulatory referral to Gastroenterology  Encounter for screening mammogram for malignant neoplasm of breast -     MM Digital Diagnostic Bilat; Future  Need for immunization against influenza -     Flu Vaccine QUAD 36+ mos IM    Follow-up: Return in about 3 months (around 02/25/2020), or if symptoms worsen or fail to improve, for schedule pap.    [name], NP

## 2019-11-25 NOTE — Progress Notes (Signed)
Pt has appointment with dermatology Novemeber 22

## 2019-11-25 NOTE — Patient Instructions (Signed)

## 2019-11-26 LAB — CMP14+EGFR
ALT: 18 IU/L (ref 0–32)
AST: 26 IU/L (ref 0–40)
Albumin/Globulin Ratio: 1.1 — ABNORMAL LOW (ref 1.2–2.2)
Albumin: 3.7 g/dL — ABNORMAL LOW (ref 3.8–4.9)
Alkaline Phosphatase: 115 IU/L (ref 44–121)
BUN/Creatinine Ratio: 18 (ref 9–23)
BUN: 14 mg/dL (ref 6–24)
Bilirubin Total: 0.4 mg/dL (ref 0.0–1.2)
CO2: 22 mmol/L (ref 20–29)
Calcium: 9.2 mg/dL (ref 8.7–10.2)
Chloride: 102 mmol/L (ref 96–106)
Creatinine, Ser: 0.76 mg/dL (ref 0.57–1.00)
GFR calc Af Amer: 103 mL/min/{1.73_m2} (ref >59)
GFR calc non Af Amer: 89 mL/min/{1.73_m2} (ref >59)
Globulin, Total: 3.5 g/dL (ref 1.5–4.5)
Glucose: 93 mg/dL (ref 65–99)
Potassium: 4.3 mmol/L (ref 3.5–5.2)
Sodium: 137 mmol/L (ref 134–144)
Total Protein: 7.2 g/dL (ref 6.0–8.5)

## 2019-11-26 LAB — CBC WITH DIFFERENTIAL/PLATELET
Basophils Absolute: 0 10*3/uL (ref 0.0–0.2)
Basos: 1 %
EOS (ABSOLUTE): 0.5 10*3/uL — ABNORMAL HIGH (ref 0.0–0.4)
Eos: 9 %
Hematocrit: 38.3 % (ref 34.0–46.6)
Hemoglobin: 12.8 g/dL (ref 11.1–15.9)
Immature Grans (Abs): 0 10*3/uL (ref 0.0–0.1)
Immature Granulocytes: 0 %
Lymphocytes Absolute: 1 10*3/uL (ref 0.7–3.1)
Lymphs: 18 %
MCH: 33.7 pg — ABNORMAL HIGH (ref 26.6–33.0)
MCHC: 33.4 g/dL (ref 31.5–35.7)
MCV: 101 fL — ABNORMAL HIGH (ref 79–97)
Monocytes Absolute: 0.7 10*3/uL (ref 0.1–0.9)
Monocytes: 12 %
Neutrophils Absolute: 3.3 10*3/uL (ref 1.4–7.0)
Neutrophils: 60 %
Platelets: 221 10*3/uL (ref 150–450)
RBC: 3.8 x10E6/uL (ref 3.77–5.28)
RDW: 15.4 % (ref 11.7–15.4)
WBC: 5.6 10*3/uL (ref 3.4–10.8)

## 2019-11-28 ENCOUNTER — Telehealth (INDEPENDENT_AMBULATORY_CARE_PROVIDER_SITE_OTHER): Payer: Self-pay

## 2019-11-28 NOTE — Telephone Encounter (Signed)
Per DPR left voicemail informing patient that labs were normal. Chermaine Schnyder Latrelle Dodrill, CMA

## 2019-11-28 NOTE — Telephone Encounter (Signed)
-----   Message from [name], NP sent at 11/27/2019  9:21 PM EDT ----- Lab results are normal

## 2020-03-28 ENCOUNTER — Emergency Department (HOSPITAL_COMMUNITY): Payer: Commercial Managed Care - PPO

## 2020-03-28 ENCOUNTER — Encounter (HOSPITAL_COMMUNITY): Payer: Self-pay

## 2020-03-28 ENCOUNTER — Observation Stay (HOSPITAL_COMMUNITY)
Admission: EM | Admit: 2020-03-28 | Discharge: 2020-03-29 | Disposition: A | Discharge status: Home / Self Care | Payer: Commercial Managed Care - PPO | Attending: Internal Medicine | Admitting: Internal Medicine

## 2020-03-28 ENCOUNTER — Other Ambulatory Visit: Payer: Self-pay

## 2020-03-28 DIAGNOSIS — R0600 Dyspnea, unspecified: Secondary | ICD-10-CM | POA: Diagnosis not present

## 2020-03-28 DIAGNOSIS — R778 Other specified abnormalities of plasma proteins: Secondary | ICD-10-CM | POA: Insufficient documentation

## 2020-03-28 DIAGNOSIS — R55 Syncope and collapse: Secondary | ICD-10-CM

## 2020-03-28 DIAGNOSIS — Z20822 Contact with and (suspected) exposure to covid-19: Secondary | ICD-10-CM | POA: Diagnosis not present

## 2020-03-28 DIAGNOSIS — I471 Supraventricular tachycardia: Secondary | ICD-10-CM | POA: Diagnosis not present

## 2020-03-28 DIAGNOSIS — Z7289 Other problems related to lifestyle: Secondary | ICD-10-CM | POA: Diagnosis not present

## 2020-03-28 DIAGNOSIS — R000 Tachycardia, unspecified: Secondary | ICD-10-CM

## 2020-03-28 DIAGNOSIS — Z79899 Other long term (current) drug therapy: Secondary | ICD-10-CM | POA: Diagnosis not present

## 2020-03-28 DIAGNOSIS — I10 Essential (primary) hypertension: Secondary | ICD-10-CM | POA: Insufficient documentation

## 2020-03-28 DIAGNOSIS — F1090 Alcohol use, unspecified, uncomplicated: Secondary | ICD-10-CM

## 2020-03-28 DIAGNOSIS — Z789 Other specified health status: Secondary | ICD-10-CM

## 2020-03-28 DIAGNOSIS — E875 Hyperkalemia: Secondary | ICD-10-CM

## 2020-03-28 DIAGNOSIS — R0602 Shortness of breath: Secondary | ICD-10-CM

## 2020-03-28 LAB — TROPONIN I (HIGH SENSITIVITY)
Troponin I (High Sensitivity): 47 ng/L — ABNORMAL HIGH (ref <18)
Troponin I (High Sensitivity): 178 ng/L (ref <18)
Troponin I (High Sensitivity): 289 ng/L (ref <18)

## 2020-03-28 LAB — CBC
HCT: 35.2 % — ABNORMAL LOW (ref 36.0–46.0)
Hemoglobin: 12 g/dL (ref 12.0–15.0)
MCH: 34.2 pg — ABNORMAL HIGH (ref 26.0–34.0)
MCHC: 34.1 g/dL (ref 30.0–36.0)
MCV: 100.3 fL — ABNORMAL HIGH (ref 80.0–100.0)
Platelets: 195 10*3/uL (ref 150–400)
RBC: 3.51 MIL/uL — ABNORMAL LOW (ref 3.87–5.11)
RDW: 14.5 % (ref 11.5–15.5)
WBC: 5.1 10*3/uL (ref 4.0–10.5)
nRBC: 0 % (ref 0.0–0.2)

## 2020-03-28 LAB — COMPREHENSIVE METABOLIC PANEL
ALT: 24 U/L (ref 0–44)
AST: 42 U/L — ABNORMAL HIGH (ref 15–41)
Albumin: 3.8 g/dL (ref 3.5–5.0)
Alkaline Phosphatase: 77 U/L (ref 38–126)
Anion gap: 13 (ref 5–15)
BUN: 10 mg/dL (ref 6–20)
CO2: 21 mmol/L — ABNORMAL LOW (ref 22–32)
Calcium: 9 mg/dL (ref 8.9–10.3)
Chloride: 99 mmol/L (ref 98–111)
Creatinine, Ser: 1.13 mg/dL — ABNORMAL HIGH (ref 0.44–1.00)
GFR, Estimated: 58 mL/min — ABNORMAL LOW (ref >60)
Glucose, Bld: 99 mg/dL (ref 70–99)
Potassium: 5.2 mmol/L — ABNORMAL HIGH (ref 3.5–5.1)
Sodium: 133 mmol/L — ABNORMAL LOW (ref 135–145)
Total Bilirubin: 1.5 mg/dL — ABNORMAL HIGH (ref 0.3–1.2)
Total Protein: 8 g/dL (ref 6.5–8.1)

## 2020-03-28 LAB — LIPID PANEL
Cholesterol: 219 mg/dL — ABNORMAL HIGH (ref 0–200)
HDL: 112 mg/dL (ref >40)
LDL Cholesterol: 91 mg/dL (ref 0–99)
Total CHOL/HDL Ratio: 2 RATIO
Triglycerides: 80 mg/dL (ref <150)
VLDL: 16 mg/dL (ref 0–40)

## 2020-03-28 LAB — MAGNESIUM: Magnesium: 2.5 mg/dL — ABNORMAL HIGH (ref 1.7–2.4)

## 2020-03-28 LAB — D-DIMER, QUANTITATIVE: D-Dimer, Quant: 0.64 ug/mL-FEU — ABNORMAL HIGH (ref 0.00–0.50)

## 2020-03-28 LAB — PHOSPHORUS: Phosphorus: 3.4 mg/dL (ref 2.5–4.6)

## 2020-03-28 MED ORDER — ALPRAZOLAM 0.25 MG PO TABS: 0.2500 mg | ORAL_TABLET | Freq: Two times a day (BID) | ORAL | Status: DC | PRN

## 2020-03-28 MED ORDER — DILTIAZEM HCL-DEXTROSE 125-5 MG/125ML-% IV SOLN (PREMIX)
5.0000 mg/h | INTRAVENOUS | Status: DC
  Administered 2020-03-28: 5 mg/h via INTRAVENOUS
  Filled 2020-03-28: qty 125

## 2020-03-28 MED ORDER — IOHEXOL 350 MG/ML SOLN
100.0000 mL | Freq: Once | INTRAVENOUS | Status: AC | PRN
  Administered 2020-03-28: 100 mL via INTRAVENOUS

## 2020-03-28 MED ORDER — METOPROLOL SUCCINATE ER 25 MG PO TB24
25.0000 mg | ORAL_TABLET | Freq: Every day | ORAL | Status: DC
  Administered 2020-03-29: 25 mg via ORAL
  Filled 2020-03-28: qty 1

## 2020-03-28 MED ORDER — FOLIC ACID 1 MG PO TABS
1.0000 mg | ORAL_TABLET | Freq: Every day | ORAL | Status: DC
  Administered 2020-03-28 – 2020-03-29 (×2): 1 mg via ORAL
  Filled 2020-03-28 (×2): qty 1

## 2020-03-28 MED ORDER — THIAMINE HCL 100 MG PO TABS
100.0000 mg | ORAL_TABLET | Freq: Every day | ORAL | Status: DC
  Administered 2020-03-28 – 2020-03-29 (×2): 100 mg via ORAL
  Filled 2020-03-28 (×2): qty 1

## 2020-03-28 MED ORDER — ONDANSETRON HCL 4 MG/2ML IJ SOLN: 4.0000 mg | Freq: Four times a day (QID) | INTRAMUSCULAR | Status: DC | PRN

## 2020-03-28 MED ORDER — THIAMINE HCL 100 MG/ML IJ SOLN: 100.0000 mg | Freq: Every day | INTRAMUSCULAR | Status: DC

## 2020-03-28 MED ORDER — METOPROLOL TARTRATE 5 MG/5ML IV SOLN: 2.5000 mg | INTRAVENOUS | Status: DC | PRN

## 2020-03-28 MED ORDER — ADULT MULTIVITAMIN W/MINERALS CH
1.0000 | ORAL_TABLET | Freq: Every day | ORAL | Status: DC
  Administered 2020-03-28 – 2020-03-29 (×2): 1 via ORAL
  Filled 2020-03-28 (×2): qty 1

## 2020-03-28 MED ORDER — METOPROLOL TARTRATE 5 MG/5ML IV SOLN
2.5000 mg | INTRAVENOUS | Status: DC | PRN
Start: 2020-03-28 — End: 2020-03-29

## 2020-03-28 MED ORDER — SODIUM CHLORIDE 0.9 % IV SOLN: INTRAVENOUS | Status: DC

## 2020-03-28 MED ORDER — ENOXAPARIN SODIUM 40 MG/0.4ML ~~LOC~~ SOLN
40.0000 mg | SUBCUTANEOUS | Status: DC
  Administered 2020-03-28: 40 mg via SUBCUTANEOUS
  Filled 2020-03-28: qty 0.4

## 2020-03-28 MED ORDER — DILTIAZEM LOAD VIA INFUSION
10.0000 mg | Freq: Once | INTRAVENOUS | Status: AC
  Administered 2020-03-28: 10 mg via INTRAVENOUS
  Filled 2020-03-28: qty 10

## 2020-03-28 MED ORDER — HYDROXYZINE HCL 10 MG PO TABS
10.0000 mg | ORAL_TABLET | Freq: Every day | ORAL | Status: DC
  Administered 2020-03-28: 10 mg via ORAL
  Filled 2020-03-28: qty 1

## 2020-03-28 MED ORDER — ACETAMINOPHEN 325 MG PO TABS: 650.0000 mg | ORAL_TABLET | ORAL | Status: DC | PRN

## 2020-03-28 MED ORDER — LORAZEPAM 1 MG PO TABS: 1.0000 mg | ORAL_TABLET | ORAL | Status: DC | PRN

## 2020-03-28 MED ORDER — LORAZEPAM 2 MG/ML IJ SOLN: 1.0000 mg | INTRAMUSCULAR | Status: DC | PRN

## 2020-03-28 NOTE — Consult Note (Signed)
CARDIOLOGY CONSULT NOTE  Patient ID: Melanie Bradshaw MRN: [id number] DOB/AGE: Apr 03, 1965 55 y.o.  Admit date: 03/28/2020 Attending physician: Harold Hedge, MD Primary Physician:  [name], NP Inpatient Cardiologist: Rex Kras, DO, [facility]  Chief complaint: shortness of breath   Reason of consultation: Tachycardia and elevated troponins Referring physician: Dr. Lajean Saver  HPI:  Melanie Bradshaw is a 55 y.o. African-American female who presents with a chief complaint of " shortness of breath." Her past medical history and cardiovascular risk factors include: Hypertension, morbid obesity, postmenopausal female, marijuana smoking, excessive alcohol consumption.  Patient presents to the hospital with a chief complaint of shortness of breath.  Cardiology is consulted given her tachycardia and elevated troponins.  Patient works as a Training and development officer and was at work this morning when she suddenly experienced shortness of breath and had trouble breathing.  She requested that she go to the ER and she was brought to [facility] by her husband.  Patient denies any chest pain at rest or with effort related activities.  Shortness of breath started this morning while she was at work.  She denies orthopnea, paroxysmal nocturnal dyspnea or lower extremity swelling.  Patient smokes at least 1-2 blunts per day and drinks 3-4 beers on a daily basis.  No prior history of coronary artery disease, PCI, CVA/TIA, congestive heart failure, PE/DVT.  Work-up thus far includes labs which is notable for hyperkalemia, elevated high sensitive troponin x2, mildly elevated D-dimer.  She underwent CT PE protocol which was negative for pulmonary embolism.  ALLERGIES: Allergies  Allergen Reactions  . Tomato     PAST MEDICAL HISTORY: Past Medical History:  Diagnosis Date  . Eczema 2010  . Hypertension   . Morbid obesity ([place])     PAST SURGICAL HISTORY: Past Surgical History:  Procedure Laterality Date  .  CESAREAN SECTION      FAMILY HISTORY: The patient family history includes Diabetes in her brother, mother, and sister; Hypertension in her mother. No family history of premature CAD or sudden cardiac arrest.    SOCIAL HISTORY:  The patient  reports that she has never smoked. She has never used smokeless tobacco. She reports current drug use. Drug: Marijuana. She reports that she does not drink alcohol.  MEDICATIONS: Current Outpatient Medications  Medication Instructions  . clobetasol ointment (TEMOVATE) 6.29 % 1 application, Topical, 2 times daily PRN  . desonide (DESOWEN) 0.05 % ointment 1 application, Topical, 2 times daily PRN  . diphenhydrAMINE (BENADRYL) 50 mg, Oral, Daily  . hydrOXYzine (ATARAX/VISTARIL) 10 mg, Oral, Daily at bedtime  . predniSONE (DELTASONE) 20 mg, Oral, Daily with breakfast  . Skin Protectants, Misc. (EUCERIN) cream Topical, As needed    14 ORGAN REVIEW OF SYSTEMS: Review of Systems  Constitutional: Negative for chills and fever.  HENT: Negative for hoarse voice and nosebleeds.   Eyes: Negative for discharge, double vision and pain.  Cardiovascular: Negative for chest pain, claudication, dyspnea on exertion, leg swelling, near-syncope, orthopnea, palpitations, paroxysmal nocturnal dyspnea and syncope.  Respiratory: Positive for shortness of breath. Negative for hemoptysis.   Musculoskeletal: Negative for muscle cramps and myalgias.  Gastrointestinal: Negative for abdominal pain, constipation, diarrhea, hematemesis, hematochezia, melena, nausea and vomiting.  Neurological: Negative for dizziness and light-headedness.  All other systems reviewed and are negative.   PHYSICAL EXAM: Vitals with BMI 03/28/2020 03/28/2020 03/28/2020  Height - - -  Weight - - -  BMI - - -  Systolic 528 413 244  Diastolic 55 80 90  Pulse 69 75 63     Intake/Output Summary (Last 24 hours) at 03/28/2020 1627 Last data filed at 03/28/2020 1227 Gross per 24 hour  Intake 15 ml   Output --  Net 15 ml    Net IO Since Admission: 15 mL [03/28/20 1627] CONSTITUTIONAL: Well-developed and well-nourished. No acute distress.  SKIN: Skin is warm and dry. No rash noted. No cyanosis. No pallor. No jaundice HEAD: Normocephalic and atraumatic.  EYES: No scleral icterus MOUTH/THROAT: Moist oral membranes.  NECK: No JVD present. No thyromegaly noted. No carotid bruits  LYMPHATIC: No visible cervical adenopathy.  CHEST Normal respiratory effort. No intercostal retractions  LUNGS: Clear to auscultation bilaterally.  No stridor. No wheezes. No rales.  CARDIOVASCULAR: Regular, positive S1-S2, no murmurs rubs or gallops appreciated ABDOMINAL: Obese, soft, nontender, distended, positive bowel sounds in all 4 quadrants, no apparent ascites.  EXTREMITIES: No peripheral edema, warm to touch HEMATOLOGIC: No significant bruising NEUROLOGIC: Oriented to person, place, and time. Nonfocal. Normal muscle tone.  PSYCHIATRIC: Normal mood and affect. Normal behavior. Cooperative  RADIOLOGY: CT Angio Chest PE W/Cm &/Or Wo Cm  Result Date: 03/28/2020 CLINICAL DATA:  Positive D-dimer. Clinical concern for pulmonary embolus. EXAM: CT ANGIOGRAPHY CHEST WITH CONTRAST TECHNIQUE: Multidetector CT imaging of the chest was performed using the standard protocol during bolus administration of intravenous contrast. Multiplanar CT image reconstructions and MIPs were obtained to evaluate the vascular anatomy. CONTRAST:  123mL OMNIPAQUE IOHEXOL 350 MG/ML SOLN COMPARISON:  Chest radiograph from the same day FINDINGS: Cardiovascular: Satisfactory opacification of the pulmonary arteries to the segmental level. No evidence of pulmonary embolism. Enlarged heart size. No pericardial effusion. Mildly ectatic ascending and descending thoracic aorta. The aortic arch measures 3.0 cm, upper limits of normal. Mediastinum/Nodes: No enlarged mediastinal, hilar, or axillary lymph nodes. Thyroid gland, trachea, and esophagus  demonstrate no significant findings. Lungs/Pleura: Lungs are clear. No pleural effusion or pneumothorax. Upper Abdomen: No acute abnormality. Musculoskeletal: No chest wall abnormality. No acute or significant osseous findings. 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 Review of the MIP images confirms the above findings. IMPRESSION: 1. No evidence of pulmonary embolus. 2. Enlarged heart size. 3. Mildly ectatic ascending and descending thoracic aorta. 4. Borderline normal to minimal aneurysmal dilation at the aortic arch. Recommend annual imaging followup by CTA or MRA. This recommendation follows 2010 ACCF/AHA/AATS/ACR/ASA/SCA/SCAI/SIR/STS/SVM Guidelines for the Diagnosis and Management of Patients with Thoracic Aortic Disease. Circulation.2010; 121: B017-P102. Aortic aneurysm NOS (ICD10-I71.9) Electronically Signed   By: Fidela Salisbury M.D.   On: 03/28/2020 12:45   DG Chest Port 1 View  Result Date: 03/28/2020 CLINICAL DATA:  Shortness of breath. EXAM: PORTABLE CHEST 1 VIEW COMPARISON:  12/04/2018 FINDINGS: Lungs are adequately inflated and otherwise clear. Cardiomediastinal silhouette and remainder of the exam is unchanged. IMPRESSION: No active disease. Electronically Signed   By: Marin Olp M.D.   On: 03/28/2020 10:38    LABORATORY DATA: Lab Results  Component Value Date   WBC 5.1 03/28/2020   HGB 12.0 03/28/2020   HCT 35.2 (L) 03/28/2020   MCV 100.3 (H) 03/28/2020   PLT 195 03/28/2020    Recent Labs  Lab 03/28/20 1043  NA 133*  K 5.2*  CL 99  CO2 21*  BUN 10  CREATININE 1.13*  CALCIUM 9.0  PROT 8.0  BILITOT 1.5*  ALKPHOS 77  ALT 24  AST 42*  GLUCOSE 99    Lipid Panel     Component Value Date/Time   CHOL 182 03/02/2014 1039  TRIG 53 03/02/2014 1039   HDL 89 03/02/2014 1039   CHOLHDL 2.0 03/02/2014 1039   VLDL 11 03/02/2014 1039   LDLCALC 82 03/02/2014 1039    BNP (last 3 results) No results for input(s): BNP in the last  8760 hours.  HEMOGLOBIN A1C Lab Results  Component Value Date   HGBA1C 5.3 06/20/2019   MPG 120 (H) 12/18/2012    Cardiac Panel (last 3 results) No results for input(s): CKTOTAL, CKMB, RELINDX in the last 8760 hours.  Invalid input(s): TROPONINHS  No results found for: CKTOTAL, CKMB, CKMBINDEX   TSH No results for input(s): TSH in the last 8760 hours.    CARDIAC DATABASE: EKG: 03/28/2020 959: Narrow complex tachycardia suggestive of AVRT, 134 bpm, normal axis, without underlying ischemia or injury pattern.  Echocardiogram: Pending  Stress Testing:  None  Heart Catheterization: None  IMPRESSION & RECOMMENDATIONS: Melanie Bradshaw is a 55 y.o. African-American female whose past medical history and cardiovascular risk factors include: Hypertension, morbid obesity, postmenopausal female, marijuana smoking, excessive alcohol consumption.  Impressions: Shortness of breath Narrow complex tachycardia suggestive of AVRT Elevated troponins Benign essential hypertension Marijuana smoking Alcohol consumption Obesity due to excess calories, BMI 35.5  Plan:  Patient presents to the hospital with symptoms of shortness of breath and tachycardia.  EKG notes narrow complex tachycardia with retrograde P waves and short RP interval findings suggestive of AVRT.  Patient was started on Cardizem while she was in the ED and converted to normal sinus rhythm.  Patient does not have any prior history of depression or suicidal/homicidal ideations.  Would recommend initiation of Toprol-XL 25 mg p.o. daily.  Medication profile discussed with the patient.  Her first troponin was elevated and a subsequent troponin continue to uptrend.  I suspect this is most likely secondary to her underlying tachycardia and hypertension.  CT PE protocol negative for pulmonary embolism.  Patient does not have any chest pain to suggest angina pectoris.  However, she has multiple cardiovascular risk factors and therefore  would recommend an echocardiogram to evaluate for structural heart disease, LVEF, and regional wall motion abnormalities.  Trend troponins  Further recommendations to follow depending on the trajectory of her high sensitive troponins and echocardiographic findings.  Educated on the importance of complete smoking cessation and reducing alcohol consumption to no more than 1 beverage per day.  Patient also carries a diagnosis of hypertension but is currently not on medical therapy.  Will defer management to primary team at this time.  Continue your care regarding her other chronic comorbid conditions and active problem list.  Total encounter time 77 minutes. *Total Encounter Time as defined by the Centers for Medicare and Medicaid Services includes, in addition to the face-to-face time of a patient visit (documented in the note above) non-face-to-face time: obtaining and reviewing outside history, ordering and reviewing medications, tests or procedures, care coordination (communications with other health care professionals or caregivers) and documentation in the medical record.  Patient's questions and concerns were addressed to her satisfaction. She voices understanding of the instructions provided during this encounter.   This note was created using a voice recognition software as a result there may be grammatical errors inadvertently enclosed that do not reflect the nature of this encounter. Every attempt is made to correct such errors.  Mechele Claude [facility]  Pager: [phone] Office: [phone] 03/28/2020, 4:27 PM

## 2020-03-28 NOTE — ED Provider Notes (Addendum)
[place] DEPT Provider Note   CSN: [id number] Arrival date & time: 03/28/20  0932     History Chief Complaint  Patient presents with  . Shortness of Breath    Melanie Bradshaw is a 55 y.o. female.  Patient with hx htn, ecxema, presents indicating acute onset sob this AM while at work. States was just standing there doing her normal work, not exerting self, feeling at baseline this AM, when acute onset sob. Symptoms acute onset, moderate, constant, persistent. Denies palpitations, but heart rate noted to be high in 130-150 range on arrival to ED.  With onset acute dyspnea, pt also states felt sweaty and as if she was about to pass out. Denies chest pain or discomfort. +non prod cough. No sore throat or runny nose. No fever or chills. Denies hx copd or asthma or prior breathing problems. Occasional thc use but otherwise not smoker. Denies known ill contacts/covid exposure. Denies leg pain or swelling. No recent surgery, immobility, travel or trauma. No hx dvt or pe. No hx cad or fam hx premature cad.  No hx dysrhythmia or afib/svt.   The history is provided by the patient.       Past Medical History:  Diagnosis Date  . Eczema 2010  . Hypertension   . Morbid obesity [facility])     Patient Active Problem List   Diagnosis Date Noted  . Essential hypertension 11/24/2013  . UTI (urinary tract infection) 05/19/2013  . Contact dermatitis 05/19/2013  . HTN (hypertension) 05/19/2013    Past Surgical History:  Procedure Laterality Date  . CESAREAN SECTION       OB History   No obstetric history on file.     Family History  Problem Relation Age of Onset  . Diabetes Mother   . Hypertension Mother   . Diabetes Sister   . Diabetes Brother     Social History   Tobacco Use  . Smoking status: Never Smoker  . Smokeless tobacco: Never Used  Substance Use Topics  . Alcohol use: No  . Drug use: Yes    Types: Marijuana    Home Medications Prior to  Admission medications   Medication Sig Start Date End Date Taking? Authorizing Provider  clobetasol ointment (TEMOVATE) 3.55 % Apply 1 application topically 2 (two) times daily as needed for dry skin. 02/23/20  Yes [provider]  desonide (DESOWEN) 0.05 % ointment Apply 1 application topically 2 (two) times daily as needed for dry skin. 01/26/20  Yes [provider]  diphenhydrAMINE (BENADRYL) 25 MG tablet Take 50 mg by mouth daily.   Yes [provider]  hydrOXYzine (ATARAX/VISTARIL) 10 MG tablet Take 10 mg by mouth at bedtime. 02/23/20  Yes [provider]  Skin Protectants, Misc. (EUCERIN) cream Apply topically as needed for dry skin. Patient taking differently: Apply 1 application topically daily as needed for dry skin. 11/25/19  Yes [name], NP  predniSONE (DELTASONE) 20 MG tablet Take 1 tablet (20 mg total) by mouth daily with breakfast. Patient not taking: Reported on 03/28/2020 11/25/19   [name], NP    Allergies    Tomato  Review of Systems   Review of Systems  Constitutional: Negative for chills and fever.  HENT: Negative for sore throat.   Eyes: Negative for redness.  Respiratory: Positive for cough and shortness of breath.   Cardiovascular: Negative for chest pain, palpitations and leg swelling.  Gastrointestinal: Negative for abdominal pain, nausea and vomiting.  Genitourinary:  Negative for flank pain.  Musculoskeletal: Negative for back pain and neck pain.  Skin: Negative for rash.  Neurological: Negative for headaches.  Hematological: Does not bruise/bleed easily.  Psychiatric/Behavioral: Negative for confusion.    Physical Exam Updated Vital Signs BP 124/85   Pulse 67   Temp 97.6 F (36.4 C) (Oral)   Resp 12   Ht 1.626 m (5\' 4" )   Wt 93.9 kg   LMP 11/06/2013   SpO2 100%   BMI 35.53 kg/m   Physical Exam Vitals and nursing note reviewed.  Constitutional:      Appearance: Normal appearance. She is  well-developed.  HENT:     Head: Atraumatic.     Nose: Nose normal.     Mouth/Throat:     Mouth: Mucous membranes are moist.  Eyes:     General: No scleral icterus.    Conjunctiva/sclera: Conjunctivae normal.     Pupils: Pupils are equal, round, and reactive to light.  Neck:     Trachea: No tracheal deviation.  Cardiovascular:     Rate and Rhythm: Regular rhythm. Tachycardia present.     Pulses: Normal pulses.     Heart sounds: Normal heart sounds. No murmur heard. No friction rub. No gallop.   Pulmonary:     Effort: Pulmonary effort is normal. No respiratory distress.     Breath sounds: Normal breath sounds.  Abdominal:     General: Bowel sounds are normal. There is no distension.     Palpations: Abdomen is soft.     Tenderness: There is no abdominal tenderness. There is no guarding.  Genitourinary:    Comments: No cva tenderness.  Musculoskeletal:        General: No swelling or tenderness.     Cervical back: Normal range of motion and neck supple. No rigidity. No muscular tenderness.     Right lower leg: No edema.     Left lower leg: No edema.  Skin:    General: Skin is warm and dry.     Findings: No rash.  Neurological:     Mental Status: She is alert.     Comments: Alert, speech normal.   Psychiatric:     Comments: Mildly anxious.      ED Results / Procedures / Treatments   Labs (all labs ordered are listed, but only abnormal results are displayed) Results for orders placed or performed during the hospital encounter of 03/28/20  CBC  Result Value Ref Range   WBC 5.1 4.0 - 10.5 K/uL   RBC 3.51 (L) 3.87 - 5.11 MIL/uL   Hemoglobin 12.0 12.0 - 15.0 g/dL   HCT 35.2 (L) 36.0 - 46.0 %   MCV 100.3 (H) 80.0 - 100.0 fL   MCH 34.2 (H) 26.0 - 34.0 pg   MCHC 34.1 30.0 - 36.0 g/dL   RDW 14.5 11.5 - 15.5 %   Platelets 195 150 - 400 K/uL   nRBC 0.0 0.0 - 0.2 %  Comprehensive metabolic panel  Result Value Ref Range   Sodium 133 (L) 135 - 145 mmol/L   Potassium 5.2 (H)  3.5 - 5.1 mmol/L   Chloride 99 98 - 111 mmol/L   CO2 21 (L) 22 - 32 mmol/L   Glucose, Bld 99 70 - 99 mg/dL   BUN 10 6 - 20 mg/dL   Creatinine, Ser 1.13 (H) 0.44 - 1.00 mg/dL   Calcium 9.0 8.9 - 10.3 mg/dL   Total Protein 8.0 6.5 - 8.1 g/dL   Albumin  3.8 3.5 - 5.0 g/dL   AST 42 (H) 15 - 41 U/L   ALT 24 0 - 44 U/L   Alkaline Phosphatase 77 38 - 126 U/L   Total Bilirubin 1.5 (H) 0.3 - 1.2 mg/dL   GFR, Estimated 58 (L) >60 mL/min   Anion gap 13 5 - 15  D-dimer, quantitative  Result Value Ref Range   D-Dimer, Quant 0.64 (H) 0.00 - 0.50 ug/mL-FEU  Troponin I (High Sensitivity)  Result Value Ref Range   Troponin I (High Sensitivity) 47 (H) <18 ng/L  Troponin I (High Sensitivity)  Result Value Ref Range   Troponin I (High Sensitivity) 178 (HH) <18 ng/L   CT Angio Chest PE W/Cm &/Or Wo Cm  Result Date: 03/28/2020 CLINICAL DATA:  Positive D-dimer. Clinical concern for pulmonary embolus. EXAM: CT ANGIOGRAPHY CHEST WITH CONTRAST TECHNIQUE: Multidetector CT imaging of the chest was performed using the standard protocol during bolus administration of intravenous contrast. Multiplanar CT image reconstructions and MIPs were obtained to evaluate the vascular anatomy. CONTRAST:  189mL OMNIPAQUE IOHEXOL 350 MG/ML SOLN COMPARISON:  Chest radiograph from the same day FINDINGS: Cardiovascular: Satisfactory opacification of the pulmonary arteries to the segmental level. No evidence of pulmonary embolism. Enlarged heart size. No pericardial effusion. Mildly ectatic ascending and descending thoracic aorta. The aortic arch measures 3.0 cm, upper limits of normal. Mediastinum/Nodes: No enlarged mediastinal, hilar, or axillary lymph nodes. Thyroid gland, trachea, and esophagus demonstrate no significant findings. Lungs/Pleura: Lungs are clear. No pleural effusion or pneumothorax. Upper Abdomen: No acute abnormality. Musculoskeletal: No chest wall abnormality. No acute or significant osseous findings. Findings  compatible with thoracic diffuse idiopathic skeletal hyperostosis. Review of the MIP images confirms the above findings. IMPRESSION: 1. No evidence of pulmonary embolus. 2. Enlarged heart size. 3. Mildly ectatic ascending and descending thoracic aorta. 4. Borderline normal to minimal aneurysmal dilation at the aortic arch. Recommend annual imaging followup by CTA or MRA. This recommendation follows 2010 ACCF/AHA/AATS/ACR/ASA/SCA/SCAI/SIR/STS/SVM Guidelines for the Diagnosis and Management of Patients with Thoracic Aortic Disease. Circulation.2010; 121: T465-K812. Aortic aneurysm NOS (ICD10-I71.9) Electronically Signed   By: Fidela Salisbury M.D.   On: 03/28/2020 12:45   DG Chest Port 1 View  Result Date: 03/28/2020 CLINICAL DATA:  Shortness of breath. EXAM: PORTABLE CHEST 1 VIEW COMPARISON:  12/04/2018 FINDINGS: Lungs are adequately inflated and otherwise clear. Cardiomediastinal silhouette and remainder of the exam is unchanged. IMPRESSION: No active disease. Electronically Signed   By: Marin Olp M.D.   On: 03/28/2020 10:38    EKG EKG Interpretation  Date/Time:  Sunday March 28 2020 12:21:29 EST Ventricular Rate:  78 PR Interval:    QRS Duration: 101 QT Interval:  399 QTC Calculation: 455 R Axis:   32 Text Interpretation: Sinus rhythm Confirmed by Lajean Saver [phone]) on 03/28/2020 12:26:53 PM   Radiology CT Angio Chest PE W/Cm &/Or Wo Cm  Result Date: 03/28/2020 CLINICAL DATA:  Positive D-dimer. Clinical concern for pulmonary embolus. EXAM: CT ANGIOGRAPHY CHEST WITH CONTRAST TECHNIQUE: Multidetector CT imaging of the chest was performed using the standard protocol during bolus administration of intravenous contrast. Multiplanar CT image reconstructions and MIPs were obtained to evaluate the vascular anatomy. CONTRAST:  124mL OMNIPAQUE IOHEXOL 350 MG/ML SOLN COMPARISON:  Chest radiograph from the same day FINDINGS: Cardiovascular: Satisfactory opacification of the pulmonary arteries  to the segmental level. No evidence of pulmonary embolism. Enlarged heart size. No pericardial effusion. Mildly ectatic ascending and descending thoracic aorta. The aortic arch measures 3.0 cm, upper limits  of normal. Mediastinum/Nodes: No enlarged mediastinal, hilar, or axillary lymph nodes. Thyroid gland, trachea, and esophagus demonstrate no significant findings. Lungs/Pleura: Lungs are clear. No pleural effusion or pneumothorax. Upper Abdomen: No acute abnormality. Musculoskeletal: No chest wall abnormality. No acute or significant osseous findings. 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 Review of the MIP images confirms the above findings. IMPRESSION: 1. No evidence of pulmonary embolus. 2. Enlarged heart size. 3. Mildly ectatic ascending and descending thoracic aorta. 4. Borderline normal to minimal aneurysmal dilation at the aortic arch. Recommend annual imaging followup by CTA or MRA. This recommendation follows 2010 ACCF/AHA/AATS/ACR/ASA/SCA/SCAI/SIR/STS/SVM Guidelines for the Diagnosis and Management of Patients with Thoracic Aortic Disease. Circulation.2010; 121: G818-H631. Aortic aneurysm NOS (ICD10-I71.9) Electronically Signed   By: Fidela Salisbury M.D.   On: 03/28/2020 12:45   DG Chest Port 1 View  Result Date: 03/28/2020 CLINICAL DATA:  Shortness of breath. EXAM: PORTABLE CHEST 1 VIEW COMPARISON:  12/04/2018 FINDINGS: Lungs are adequately inflated and otherwise clear. Cardiomediastinal silhouette and remainder of the exam is unchanged. IMPRESSION: No active disease. Electronically Signed   By: Marin Olp M.D.   On: 03/28/2020 10:38    Procedures Procedures   Medications Ordered in ED Medications  diltiazem (CARDIZEM) 1 mg/mL load via infusion 10 mg (10 mg Intravenous Bolus from Bag 03/28/20 1104)    And  diltiazem (CARDIZEM) 125 mg in dextrose 5% 125 mL (1 mg/mL) infusion (0 mg/hr Intravenous Stopped 03/28/20 1227)  iohexol (OMNIPAQUE) 350 MG/ML  injection 100 mL (100 mLs Intravenous Contrast Given 03/28/20 1228)    ED Course  I have reviewed the triage vital signs and the nursing notes.  Pertinent labs & imaging results that were available during my care of the patient were reviewed by me and considered in my medical decision making (see chart for details).    MDM Rules/Calculators/A&P                         Iv ns. Continuous pulse ox and cardiac monitoring. Stat labs and pcxr. Ecg.   Reviewed nursing notes and prior charts for additional history.   Labs reviewed/intepreted by me - ddimer mildly elevated - will get ct r/o PE.  Initial trop is mildly high, ?rate relates (hr 130-150), as pt denies chest pain or discomfort.   CXR reviewed/interpreted by me - no pna.   Given earlier narrow complex tachycardia/new dysrhythmia, pre-syncope, elevated trop, will admit for observation.   Discussed with hospitalist - will admit.   Cardiology consulted - await call back. Discussed pt with Dr Terri Skains - he agrees w hosp admit/obs, and says he will consult.   CHadsvasc score  = 2.       Final Clinical Impression(s) / ED Diagnoses Final diagnoses:  None    Rx / DC Orders ED Discharge Orders    None           Lajean Saver, MD 03/28/20 1541

## 2020-03-28 NOTE — Progress Notes (Addendum)
Subjective:  Patient's troponin peaked at 289, then trended down to 255. Troponin since presentation: 47 --> 178 --> 289 -->255 Patient's heart rate and blood pressure control have both improved.  She states shortness of breath has resolved.  Denies chest pain, palpitations, dizziness, syncope, near syncope.  Denies orthopnea or PND. Diltiazem infusion was stopped yesterday at 12:27, her rate has remained well controlled on metoprolol succinate.   Intake/Output from previous day:  I/O last 3 completed shifts: In: 761.4 [I.V.:761.4] Out: -  No intake/output data recorded.  Blood pressure 95/71, pulse 64, temperature 98.2 F (36.8 C), temperature source Oral, resp. rate 19, height 5' 4"  (1.626 m), weight 93.9 kg, last menstrual period 11/06/2013, SpO2 100 %. Physical Exam Vitals and nursing note reviewed.  Constitutional:      General: She is not in acute distress.    Appearance: She is well-developed. She is obese. She is not ill-appearing.  HENT:     Head: Normocephalic and atraumatic.     Mouth/Throat:     Mouth: Mucous membranes are moist.  Eyes:     Extraocular Movements: Extraocular movements intact.     Pupils: Pupils are equal, round, and reactive to light.  Neck:     Vascular: No carotid bruit or JVD.  Cardiovascular:     Rate and Rhythm: Normal rate and regular rhythm.     Heart sounds: No murmur heard. No gallop.   Pulmonary:     Effort: Pulmonary effort is normal.     Breath sounds: Normal breath sounds. No wheezing, rhonchi or rales.  Abdominal:     General: Bowel sounds are normal. There is no distension.     Palpations: Abdomen is soft.     Tenderness: There is no abdominal tenderness.  Musculoskeletal:        General: Normal range of motion.     Right lower leg: No edema.     Left lower leg: No edema.  Skin:    General: Skin is warm and dry.     Capillary Refill: Capillary refill takes less than 2 seconds.  Neurological:     General: No focal deficit  present.     Mental Status: She is alert and oriented to person, place, and time.     Cranial Nerves: No cranial nerve deficit.  Psychiatric:        Mood and Affect: Mood normal.        Behavior: Behavior normal.    Lab Results: BMP BNP (last 3 results) No results for input(s): BNP in the last 8760 hours.  ProBNP (last 3 results) No results for input(s): PROBNP in the last 8760 hours. BMP Latest Ref Rng & Units 03/29/2020 03/28/2020 11/25/2019  Glucose 70 - 99 mg/dL 81 99 93  BUN 6 - 20 mg/dL 10 10 14   Creatinine 0.44 - 1.00 mg/dL 0.78 1.13(H) 0.76  BUN/Creat Ratio 9 - 23 - - 18  Sodium 135 - 145 mmol/L 135 133(L) 137  Potassium 3.5 - 5.1 mmol/L 4.0 5.2(H) 4.3  Chloride 98 - 111 mmol/L 104 99 102  CO2 22 - 32 mmol/L 22 21(L) 22  Calcium 8.9 - 10.3 mg/dL 8.1(L) 9.0 9.2   Hepatic Function Latest Ref Rng & Units 03/28/2020 11/25/2019 03/02/2014  Total Protein 6.5 - 8.1 g/dL 8.0 7.2 7.2  Albumin 3.5 - 5.0 g/dL 3.8 3.7(L) 3.9  AST 15 - 41 U/L 42(H) 26 16  ALT 0 - 44 U/L 24 18 13   Alk Phosphatase 38 - 126  U/L 77 115 57  Total Bilirubin 0.3 - 1.2 mg/dL 1.5(H) 0.4 0.4   CBC Latest Ref Rng & Units 03/29/2020 03/28/2020 11/25/2019  WBC 4.0 - 10.5 K/uL 4.0 5.1 5.6  Hemoglobin 12.0 - 15.0 g/dL 10.3(L) 12.0 12.8  Hematocrit 36.0 - 46.0 % 30.1(L) 35.2(L) 38.3  Platelets 150 - 400 K/uL 178 195 221   Lipid Panel     Component Value Date/Time   CHOL 219 (H) 03/28/2020 1252   TRIG 80 03/28/2020 1252   HDL 112 03/28/2020 1252   CHOLHDL 2.0 03/28/2020 1252   VLDL 16 03/28/2020 1252   LDLCALC 91 03/28/2020 1252   Cardiac Panel (last 3 results) No results for input(s): CKTOTAL, CKMB, TROPONINI, RELINDX in the last 72 hours.  Troponin:  47 --> 178 --> 289   HEMOGLOBIN A1C Lab Results  Component Value Date   HGBA1C 5.3 06/20/2019   MPG 120 (H) 12/18/2012   TSH No results for input(s): TSH in the last 8760 hours. Imaging: Imaging results have been reviewed and CT Angio Chest PE W/Cm  &/Or Wo Cm  Result Date: 03/28/2020 CLINICAL DATA:  Positive D-dimer. Clinical concern for pulmonary embolus. EXAM: CT ANGIOGRAPHY CHEST WITH CONTRAST TECHNIQUE: Multidetector CT imaging of the chest was performed using the standard protocol during bolus administration of intravenous contrast. Multiplanar CT image reconstructions and MIPs were obtained to evaluate the vascular anatomy. CONTRAST:  114m OMNIPAQUE IOHEXOL 350 MG/ML SOLN COMPARISON:  Chest radiograph from the same day FINDINGS: Cardiovascular: Satisfactory opacification of the pulmonary arteries to the segmental level. No evidence of pulmonary embolism. Enlarged heart size. No pericardial effusion. Mildly ectatic ascending and descending thoracic aorta. The aortic arch measures 3.0 cm, upper limits of normal. Mediastinum/Nodes: No enlarged mediastinal, hilar, or axillary lymph nodes. Thyroid gland, trachea, and esophagus demonstrate no significant findings. Lungs/Pleura: Lungs are clear. No pleural effusion or pneumothorax. Upper Abdomen: No acute abnormality. Musculoskeletal: No chest wall abnormality. No acute or significant osseous findings. 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 Review of the MIP images confirms the above findings. IMPRESSION: 1. No evidence of pulmonary embolus. 2. Enlarged heart size. 3. Mildly ectatic ascending and descending thoracic aorta. 4. Borderline normal to minimal aneurysmal dilation at the aortic arch. Recommend annual imaging followup by CTA or MRA. This recommendation follows 2010 ACCF/AHA/AATS/ACR/ASA/SCA/SCAI/SIR/STS/SVM Guidelines for the Diagnosis and Management of Patients with Thoracic Aortic Disease. Circulation.2010; 121:: O878-M767 Aortic aneurysm NOS (ICD10-I71.9) Electronically Signed   By: DFidela SalisburyM.D.   On: 03/28/2020 12:45   DG Chest Port 1 View  Result Date: 03/28/2020 CLINICAL DATA:  Shortness of breath. EXAM: PORTABLE CHEST 1 VIEW COMPARISON:   12/04/2018 FINDINGS: Lungs are adequately inflated and otherwise clear. Cardiomediastinal silhouette and remainder of the exam is unchanged. IMPRESSION: No active disease. Electronically Signed   By: DMarin OlpM.D.   On: 03/28/2020 10:38    Cardiac Studies: EKG: 03/28/2020 959: Narrow complex tachycardia suggestive of AVRT, 134 bpm, normal axis, without underlying ischemia or injury pattern.   03/28/2020 12:21: Sinus rhythm at a rate of 78 bpm.  Normal axis.  Poor R wave progression, cannot exclude anteroseptal infarct old.  No evidence of ischemia or injury pattern.   Echocardiogram: Pending  Stress Testing:  None  Heart Catheterization: None  Telemetry:  Sinus rhythm with well-controlled rate 65-75 bpm.    Recent Results (from the past 43800 hour(s))  ECHOCARDIOGRAM COMPLETE   Collection Time: 03/29/20  8:56 AM  Result Value   Weight 3,312  Height 64   BP 95/71   *Note: Due to a large number of results and/or encounters for the requested time period, some results have not been displayed. A complete set of results can be found in Results Review.    Scheduled Meds: . enoxaparin (LOVENOX) injection  40 mg Subcutaneous Q24H  . folic acid  1 mg Oral Daily  . hydrOXYzine  10 mg Oral QHS  . metoprolol succinate  25 mg Oral Daily  . multivitamin with minerals  1 tablet Oral Daily  . thiamine  100 mg Oral Daily   Or  . thiamine  100 mg Intravenous Daily   Continuous Infusions: . diltiazem (CARDIZEM) infusion Stopped (03/28/20 1227)   PRN Meds:.acetaminophen, LORazepam **OR** LORazepam, metoprolol tartrate, ondansetron (ZOFRAN) IV  Assessment/Plan:  Patient's symptoms of dyspnea have resolved and she is maintaining sinus rhythm with well-controlled rate.  We will continue Metoprolol succinate 25 mg daily.  Patient will need ischemic evaluation by stress test or coronary CT to further investigate potential ischemic underlying etiology of patient's tachycardia and  shortness of breath.  Echocardiogram is currently pending, however if it is without significant abnormalities shared decision with patient was to proceed with ischemic evaluation on an outpatient basis.  If echocardiogram is without significant abnormalities, patient stable for discharge from a cardiovascular standpoint.  If echocardiogram does reveal LV dysfunction or wall motion abnormalities, would recommend she undergo inpatient coronary angiography.   Again discussed with patient reducing cardiovascular risk factors, including weight loss, there along smoking cessation, and decreased alcohol consumption.  Patient was seen in collaboration with Dr. Terri Skains. He also reviewed patient's chart and examined the patient. Dr. Terri Skains is in agreement of the plan.   Total time spent: 25 minutes    Alethia Berthold, PA-C 03/29/2020, 12:22 PM Office: [phone]

## 2020-03-28 NOTE — ED Triage Notes (Signed)
Patient c/o shortness of breath started this morning while at work.

## 2020-03-28 NOTE — H&P (Addendum)
History and Physical        Hospital Admission Note Date: 03/28/2020  Patient name: Melanie Bradshaw Medical record number: [id number] Date of birth: 05-Feb-1965 Age: 55 y.o. Gender: female  PCP: [name], NP   Chief Complaint    Chief Complaint  Patient presents with  . Shortness of Breath      HPI:   This is a 55 year old female with past medical history of hypertension, morbid obesity who presented to the ED with acute onset shortness of breath this a.m. while at work. Patient works at [employer] and was in her usual state of health and cooking when she had sudden onset shortness of breath and diaphoresis and headache and presyncopal. No chest pain or radiation of any symptoms. No leg swelling. Denies tobacco use but admits to marijuana and alcohol use and drinks 3-4 beers daily, last drink was yesterday evening. Denies any history of alcohol withdrawal symptoms. No history of VTE, COPD, Asthma. Patient was noted to be tachycardic 140s-150s upon arrival with narrow complex tachycardia, elevated troponin and was given a cardizem bolus/drip and converted to NSR. She feels better currently and is hoping to go home but understands that she should be observed overnight and is willing to stay.   ED Course: tachycardic, tachypneic,  hemodynamically stable, on room air. Notable Labs: Na 133, K 5.2, Cr 1.13, AST 42, ALT 24, T bili 1.5, WBC 5.1, Hb 12.0, platelets 195, Troponin 47-> 178, D dimer 0.64, COVID 19 pending. Notable Imaging: CXR unremarkable. CTA chest  - negative for PE, enlarged heart size, mildly ectatic ascending and descending thoracic aorta and borderline normal to minimal aneurysmal dilation at the aortic arch. Patient received Diltiazem bolus and drip (currently off).    Vitals:   03/28/20 1500 03/28/20 1515  BP: 120/80 (!) 117/55  Pulse: 75 69  Resp: 17 12   Temp:    SpO2: 100% 100%     Review of Systems:  Review of Systems  All other systems reviewed and are negative.   Medical/Social/Family History   Past Medical History: Past Medical History:  Diagnosis Date  . Eczema 2010  . Hypertension   . Morbid obesity ([place])     Past Surgical History:  Procedure Laterality Date  . CESAREAN SECTION      Medications: Prior to Admission medications   Medication Sig Start Date End Date Taking? Authorizing Provider  clobetasol ointment (TEMOVATE) 6.25 % Apply 1 application topically 2 (two) times daily as needed for dry skin. 02/23/20  Yes [provider]  desonide (DESOWEN) 0.05 % ointment Apply 1 application topically 2 (two) times daily as needed for dry skin. 01/26/20  Yes [provider]  diphenhydrAMINE (BENADRYL) 25 MG tablet Take 50 mg by mouth daily.   Yes [provider]  hydrOXYzine (ATARAX/VISTARIL) 10 MG tablet Take 10 mg by mouth at bedtime. 02/23/20  Yes [provider]  Skin Protectants, Misc. (EUCERIN) cream Apply topically as needed for dry skin. Patient taking differently: Apply 1 application topically daily as needed for dry skin. 11/25/19  Yes [name], NP  predniSONE (DELTASONE) 20 MG tablet Take 1 tablet (20 mg total) by mouth daily with breakfast. Patient not  taking: Reported on 03/28/2020 11/25/19   [name], NP    Allergies:   Allergies  Allergen Reactions  . Tomato     Social History:  reports that she has never smoked. She has never used smokeless tobacco. She reports current drug use. Drug: Marijuana. She reports that she does not drink alcohol.  Family History: Family History  Problem Relation Age of Onset  . Diabetes Mother   . Hypertension Mother   . Diabetes Sister   . Diabetes Brother      Objective   Physical Exam: Blood pressure (!) 117/55, pulse 69, temperature 97.6 F (36.4 C), temperature source Oral, resp. rate 12, height 5\' 4"   (1.626 m), weight 93.9 kg, last menstrual period 11/06/2013, SpO2 100 %.  Physical Exam Vitals and nursing note reviewed.  Constitutional:      Appearance: Normal appearance.  HENT:     Head: Normocephalic and atraumatic.  Eyes:     Conjunctiva/sclera: Conjunctivae normal.  Cardiovascular:     Rate and Rhythm: Normal rate and regular rhythm.  Pulmonary:     Effort: Pulmonary effort is normal.     Breath sounds: Normal breath sounds.  Abdominal:     General: Abdomen is flat.     Palpations: Abdomen is soft.  Musculoskeletal:        General: No swelling or tenderness.  Skin:    Coloration: Skin is not jaundiced or pale.  Neurological:     Mental Status: She is alert. Mental status is at baseline.  Psychiatric:        Mood and Affect: Mood normal.        Behavior: Behavior normal.     LABS on Admission: I have personally reviewed all the labs and imaging below    Basic Metabolic Panel: Recent Labs  Lab 03/28/20 1043  NA 133*  K 5.2*  CL 99  CO2 21*  GLUCOSE 99  BUN 10  CREATININE 1.13*  CALCIUM 9.0   Liver Function Tests: Recent Labs  Lab 03/28/20 1043  AST 42*  ALT 24  ALKPHOS 77  BILITOT 1.5*  PROT 8.0  ALBUMIN 3.8   No results for input(s): LIPASE, AMYLASE in the last 168 hours. No results for input(s): AMMONIA in the last 168 hours. CBC: Recent Labs  Lab 03/28/20 1043  WBC 5.1  HGB 12.0  HCT 35.2*  MCV 100.3*  PLT 195   Cardiac Enzymes: No results for input(s): CKTOTAL, CKMB, CKMBINDEX, TROPONINI in the last 168 hours. BNP: Invalid input(s): POCBNP CBG: No results for input(s): GLUCAP in the last 168 hours.  Radiological Exams on Admission:  CT Angio Chest PE W/Cm &/Or Wo Cm  Result Date: 03/28/2020 CLINICAL DATA:  Positive D-dimer. Clinical concern for pulmonary embolus. EXAM: CT ANGIOGRAPHY CHEST WITH CONTRAST TECHNIQUE: Multidetector CT imaging of the chest was performed using the standard protocol during bolus administration of  intravenous contrast. Multiplanar CT image reconstructions and MIPs were obtained to evaluate the vascular anatomy. CONTRAST:  180mL OMNIPAQUE IOHEXOL 350 MG/ML SOLN COMPARISON:  Chest radiograph from the same day FINDINGS: Cardiovascular: Satisfactory opacification of the pulmonary arteries to the segmental level. No evidence of pulmonary embolism. Enlarged heart size. No pericardial effusion. Mildly ectatic ascending and descending thoracic aorta. The aortic arch measures 3.0 cm, upper limits of normal. Mediastinum/Nodes: No enlarged mediastinal, hilar, or axillary lymph nodes. Thyroid gland, trachea, and esophagus demonstrate no significant findings. Lungs/Pleura: Lungs are clear. No pleural effusion or pneumothorax. Upper Abdomen: No acute abnormality.  Musculoskeletal: No chest wall abnormality. No acute or significant osseous findings. 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 Review of the MIP images confirms the above findings. IMPRESSION: 1. No evidence of pulmonary embolus. 2. Enlarged heart size. 3. Mildly ectatic ascending and descending thoracic aorta. 4. Borderline normal to minimal aneurysmal dilation at the aortic arch. Recommend annual imaging followup by CTA or MRA. This recommendation follows 2010 ACCF/AHA/AATS/ACR/ASA/SCA/SCAI/SIR/STS/SVM Guidelines for the Diagnosis and Management of Patients with Thoracic Aortic Disease. Circulation.2010; 121: X106-Y694. Aortic aneurysm NOS (ICD10-I71.9) Electronically Signed   By: Fidela Salisbury M.D.   On: 03/28/2020 12:45   DG Chest Port 1 View  Result Date: 03/28/2020 CLINICAL DATA:  Shortness of breath. EXAM: PORTABLE CHEST 1 VIEW COMPARISON:  12/04/2018 FINDINGS: Lungs are adequately inflated and otherwise clear. Cardiomediastinal silhouette and remainder of the exam is unchanged. IMPRESSION: No active disease. Electronically Signed   By: Marin Olp M.D.   On: 03/28/2020 10:38      EKG: Initial EKG: Normal  axis, narrow complex tachycardia (134 bpm), regular rhythm with nonspecific T wave inversions in V1 without P waves  Repeat EKG: NSR, HR 78, with T wave inversions in V1   A & P   Principal Problem:   Shortness of breath Active Problems:   Essential hypertension   Tachycardia   Hyperkalemia   Alcohol use   1. Shortness of breath with diaphoresis  Presyncope, possibly from tachyarrhythmia, SVT a. Hemodynamically stable on room air b. Troponin 47-> 178 c. Converted to NSR following Cardizem bolus and drip and is currently off drip maintaining sinus d. Continue to trend troponin e. Echo  f. PRN lopressor g. Cardiology consulted, Dr. Terri Skains will see   2. Hypertension a. Not on any home meds  3. Hyperkalemia a. K 5.2  b. IV fluids  4. Alcohol use a. Advised cessation b. 3-4 beers daily, last drink was last night c. CIWA protocol    DVT prophylaxis: lovenox   Code Status: Full Code  Diet: heart healthy Family Communication: Admission, patients condition and plan of care including tests being ordered have been discussed with the patient who indicates understanding and agrees with the plan and Code Status. Patient's family at bedside was updated  Disposition Plan: The appropriate patient status for this patient is OBSERVATION. Observation status is judged to be reasonable and necessary in order to provide the required intensity of service to ensure the patient's safety. The patient's presenting symptoms, physical exam findings, and initial radiographic and laboratory data in the context of their medical condition is felt to place them at decreased risk for further clinical deterioration. Furthermore, it is anticipated that the patient will be medically stable for discharge from the hospital within 2 midnights of admission. The following factors support the patient status of observation.   " The patient's presenting symptoms include shortness of breath. " The physical exam  findings include unremarkable. " The initial radiographic and laboratory data are elevated troponin.    Consultants  . cardiology  Procedures  . none  Time Spent on Admission: 56 minutes    Harold Hedge, DO [facility]  03/28/2020, 4:22 PM

## 2020-03-28 NOTE — ED Notes (Signed)
ED TO INPATIENT HANDOFF REPORT  ED Nurse Name and Phone #: [phone]  S Name/Age/Gender Melanie Bradshaw 55 y.o. female Room/Bed: WA15/WA15  Code Status   Code Status: Full Code  Home/SNF/Other Home Patient oriented to: self, place, time and situation Is this baseline? Yes   Triage Complete: Triage complete  Chief Complaint Shortness of breath [R06.02]  Triage Note Patient c/o shortness of breath started this morning while at work.     Allergies Allergies  Allergen Reactions  . Tomato     Level of Care/Admitting Diagnosis ED Disposition    ED Disposition Condition Comment   Admit  Hospital Area: [place] [[id number]]  Level of Care: Telemetry [5]  Admit to tele based on following criteria: Complex arrhythmia (Bradycardia/Tachycardia)  Covid Evaluation: Asymptomatic Screening Protocol (No Symptoms)  Diagnosis: Shortness of breath [786.05.ICD-9-CM]  Admitting Physician: Harold Hedge [[id number]]  Attending Physician: Harold Hedge [[id number]]       B Medical/Surgery History Past Medical History:  Diagnosis Date  . Eczema 2010  . Hypertension   . Morbid obesity ([place])    Past Surgical History:  Procedure Laterality Date  . CESAREAN SECTION       A IV Location/Drains/Wounds Patient Lines/Drains/Airways Status    Active Line/Drains/Airways    Name Placement date Placement time Site Days   Peripheral IV 03/28/20 Left Antecubital 03/28/20  1043  Antecubital  less than 1          Intake/Output Last 24 hours  Intake/Output Summary (Last 24 hours) at 03/28/2020 1636 Last data filed at 03/28/2020 1227 Gross per 24 hour  Intake 15 ml  Output --  Net 15 ml    Labs/Imaging Results for orders placed or performed during the hospital encounter of 03/28/20 (from the past 48 hour(s))  CBC     Status: Abnormal   Collection Time: 03/28/20 10:43 AM  Result Value Ref Range   WBC 5.1 4.0 - 10.5 K/uL   RBC 3.51 (L) 3.87 - 5.11 MIL/uL   Hemoglobin  12.0 12.0 - 15.0 g/dL   HCT 35.2 (L) 36.0 - 46.0 %   MCV 100.3 (H) 80.0 - 100.0 fL   MCH 34.2 (H) 26.0 - 34.0 pg   MCHC 34.1 30.0 - 36.0 g/dL   RDW 14.5 11.5 - 15.5 %   Platelets 195 150 - 400 K/uL   nRBC 0.0 0.0 - 0.2 %    Comment: Performed at [facility], [place] [address] [place], [place] [address]  Comprehensive metabolic panel     Status: Abnormal   Collection Time: 03/28/20 10:43 AM  Result Value Ref Range   Sodium 133 (L) 135 - 145 mmol/L   Potassium 5.2 (H) 3.5 - 5.1 mmol/L    Comment: MODERATE HEMOLYSIS   Chloride 99 98 - 111 mmol/L   CO2 21 (L) 22 - 32 mmol/L   Glucose, Bld 99 70 - 99 mg/dL    Comment: Glucose reference range applies only to samples taken after fasting for at least 8 hours.   BUN 10 6 - 20 mg/dL   Creatinine, Ser 1.13 (H) 0.44 - 1.00 mg/dL   Calcium 9.0 8.9 - 10.3 mg/dL   Total Protein 8.0 6.5 - 8.1 g/dL   Albumin 3.8 3.5 - 5.0 g/dL   AST 42 (H) 15 - 41 U/L   ALT 24 0 - 44 U/L   Alkaline Phosphatase 77 38 - 126 U/L   Total Bilirubin 1.5 (H) 0.3 - 1.2 mg/dL  Comment: MODERATE HEMOLYSIS   GFR, Estimated 58 (L) >60 mL/min    Comment: (NOTE) Calculated using the CKD-EPI Creatinine Equation (2021)    Anion gap 13 5 - 15    Comment: Performed at [facility], [place] [address], [place], [place] [address]  D-dimer, quantitative     Status: Abnormal   Collection Time: 03/28/20 10:43 AM  Result Value Ref Range   D-Dimer, Quant 0.64 (H) 0.00 - 0.50 ug/mL-FEU    Comment: (NOTE) At the manufacturer cut-off value of 0.5 g/mL FEU, this assay has a negative predictive value of 95-100%.This assay is intended for use in conjunction with a clinical pretest probability (PTP) assessment model to exclude pulmonary embolism (PE) and deep venous thrombosis (DVT) in outpatients suspected of PE or DVT. Results should be correlated with clinical presentation. Performed at [facility], [place] [address], [place], [place] [address]   Troponin I (High Sensitivity)     Status: Abnormal   Collection Time: 03/28/20 10:43 AM  Result Value Ref Range   Troponin I (High Sensitivity) 47 (H) <18 ng/L    Comment: (NOTE) Elevated high sensitivity troponin I (hsTnI) values and significant  changes across serial measurements may suggest ACS but many other  chronic and acute conditions are known to elevate hsTnI results.  Refer to the Links section for chest pain algorithms and additional  guidance. Performed at [facility], [place] [address] [place], [place] [address]   Troponin I (High Sensitivity)     Status: Abnormal   Collection Time: 03/28/20 12:52 PM  Result Value Ref Range   Troponin I (High Sensitivity) 178 (HH) <18 ng/L    Comment: DELTA CHECK NOTED CRITICAL RESULT CALLED TO, READ BACK BY AND VERIFIED WITH: C,ARTHUR AT 1344 ON 03/28/20 BY A,MOHAMED (NOTE) Elevated high sensitivity troponin I (hsTnI) values and significant  changes across serial measurements may suggest ACS but many other  chronic and acute conditions are known to elevate hsTnI results.  Refer to the Links section for chest pain algorithms and additional  guidance. Performed at [facility], [place] [address] [place], [place] [address]    CT Angio Chest PE W/Cm &/Or Wo Cm  Result Date: 03/28/2020 CLINICAL DATA:  Positive D-dimer. Clinical concern for pulmonary embolus. EXAM: CT ANGIOGRAPHY CHEST WITH CONTRAST TECHNIQUE: Multidetector CT imaging of the chest was performed using the standard protocol during bolus administration of intravenous contrast. Multiplanar CT image reconstructions and MIPs were obtained to evaluate the vascular anatomy. CONTRAST:  142mL OMNIPAQUE IOHEXOL 350 MG/ML SOLN COMPARISON:  Chest radiograph from the same day FINDINGS: Cardiovascular: Satisfactory opacification of the pulmonary arteries to the segmental level. No evidence of pulmonary embolism. Enlarged heart  size. No pericardial effusion. Mildly ectatic ascending and descending thoracic aorta. The aortic arch measures 3.0 cm, upper limits of normal. Mediastinum/Nodes: No enlarged mediastinal, hilar, or axillary lymph nodes. Thyroid gland, trachea, and esophagus demonstrate no significant findings. Lungs/Pleura: Lungs are clear. No pleural effusion or pneumothorax. Upper Abdomen: No acute abnormality. Musculoskeletal: No chest wall abnormality. No acute or significant osseous findings. 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 Review of the MIP images confirms the above findings. IMPRESSION: 1. No evidence of pulmonary embolus. 2. Enlarged heart size. 3. Mildly ectatic ascending and descending thoracic aorta. 4. Borderline normal to minimal aneurysmal dilation at the aortic arch. Recommend annual imaging followup by CTA or MRA. This recommendation follows 2010 ACCF/AHA/AATS/ACR/ASA/SCA/SCAI/SIR/STS/SVM Guidelines for the Diagnosis and Management of  Patients with Thoracic Aortic Disease. Circulation.2010; 121: P710-G269. Aortic aneurysm NOS (ICD10-I71.9) Electronically Signed   By: Fidela Salisbury M.D.   On: 03/28/2020 12:45   DG Chest Port 1 View  Result Date: 03/28/2020 CLINICAL DATA:  Shortness of breath. EXAM: PORTABLE CHEST 1 VIEW COMPARISON:  12/04/2018 FINDINGS: Lungs are adequately inflated and otherwise clear. Cardiomediastinal silhouette and remainder of the exam is unchanged. IMPRESSION: No active disease. Electronically Signed   By: Marin Olp M.D.   On: 03/28/2020 10:38    Pending Labs Unresulted Labs (From admission, onward)          Start     Ordered   03/29/20 0500  CBC  Tomorrow morning,   R       Question:  Specimen collection method  Answer:  Lab=Lab collect   03/28/20 1559   03/29/20 4854  Basic metabolic panel  Tomorrow morning,   R       Question:  Specimen collection method  Answer:  Lab=Lab collect   03/28/20 1559   03/28/20 1610  SARS  CORONAVIRUS 2 (TAT 6-24 HRS) Nasopharyngeal Nasopharyngeal Swab  (Tier 3 - Symptomatic/asymptomatic with Precautions)  Once,   STAT       Question Answer Comment  Is this test for diagnosis or screening Screening   Symptomatic for COVID-19 as defined by CDC No   Hospitalized for COVID-19 No   Admitted to ICU for COVID-19 No   Previously tested for COVID-19 No   Resident in a congregate (group) care setting No   Employed in healthcare setting No   Pregnant No   Has patient completed COVID vaccination(s) (2 doses of Pfizer/Moderna 1 dose of [employer]) Yes   Has patient completed COVID Booster / 3rd dose Unknown      03/28/20 1609   03/28/20 1604  Magnesium  Add-on,   AD        03/28/20 1603   03/28/20 1604  Phosphorus  Add-on,   AD        03/28/20 1603   03/28/20 1600  Lipid panel  Add-on,   AD       Question:  Specimen collection method  Answer:  Lab=Lab collect   03/28/20 1559          Vitals/Pain Today's Vitals   03/28/20 1530 03/28/20 1545 03/28/20 1600 03/28/20 1630  BP: (!) 137/97 (!) 139/102 123/89 110/80  Pulse: 77 64 70 70  Resp: (!) 23 15 17 18   Temp:      TempSrc:      SpO2: 100% 100% 100% 99%  Weight:      Height:      PainSc:        Isolation Precautions Airborne and Contact precautions  Medications Medications  diltiazem (CARDIZEM) 1 mg/mL load via infusion 10 mg (10 mg Intravenous Bolus from Bag 03/28/20 1104)    And  diltiazem (CARDIZEM) 125 mg in dextrose 5% 125 mL (1 mg/mL) infusion (0 mg/hr Intravenous Stopped 03/28/20 1227)  hydrOXYzine (ATARAX/VISTARIL) tablet 10 mg (has no administration in time range)  acetaminophen (TYLENOL) tablet 650 mg (has no administration in time range)  ondansetron (ZOFRAN) injection 4 mg (has no administration in time range)  enoxaparin (LOVENOX) injection 40 mg (has no administration in time range)  LORazepam (ATIVAN) tablet 1-4 mg (has no administration in time range)    Or  LORazepam (ATIVAN) injection 1-4  mg (has no administration in time range)  thiamine tablet 100 mg (has no administration in  time range)    Or  thiamine (B-1) injection 100 mg (has no administration in time range)  folic acid (FOLVITE) tablet 1 mg (has no administration in time range)  multivitamin with minerals tablet 1 tablet (has no administration in time range)  metoprolol tartrate (LOPRESSOR) injection 2.5 mg (has no administration in time range)  0.9 %  sodium chloride infusion (has no administration in time range)  iohexol (OMNIPAQUE) 350 MG/ML injection 100 mL (100 mLs Intravenous Contrast Given 03/28/20 1228)    Mobility walks Low fall risk   Focused Assessments .   R Recommendations: See Admitting Provider Note  Report given to:   Additional Notes: n/a

## 2020-03-28 NOTE — Plan of Care (Signed)

## 2020-03-28 NOTE — Plan of Care (Signed)
Care plan initiated.

## 2020-03-29 ENCOUNTER — Observation Stay (HOSPITAL_COMMUNITY): Payer: Commercial Managed Care - PPO

## 2020-03-29 ENCOUNTER — Ambulatory Visit: Payer: Commercial Managed Care - PPO | Admitting: Student

## 2020-03-29 DIAGNOSIS — R7989 Other specified abnormal findings of blood chemistry: Secondary | ICD-10-CM

## 2020-03-29 DIAGNOSIS — R0600 Dyspnea, unspecified: Secondary | ICD-10-CM | POA: Diagnosis not present

## 2020-03-29 DIAGNOSIS — I471 Supraventricular tachycardia, unspecified: Secondary | ICD-10-CM

## 2020-03-29 DIAGNOSIS — R000 Tachycardia, unspecified: Secondary | ICD-10-CM | POA: Diagnosis not present

## 2020-03-29 DIAGNOSIS — R778 Other specified abnormalities of plasma proteins: Secondary | ICD-10-CM

## 2020-03-29 DIAGNOSIS — Z7289 Other problems related to lifestyle: Secondary | ICD-10-CM | POA: Diagnosis not present

## 2020-03-29 LAB — BASIC METABOLIC PANEL
Anion gap: 9 (ref 5–15)
BUN: 10 mg/dL (ref 6–20)
CO2: 22 mmol/L (ref 22–32)
Calcium: 8.1 mg/dL — ABNORMAL LOW (ref 8.9–10.3)
Chloride: 104 mmol/L (ref 98–111)
Creatinine, Ser: 0.78 mg/dL (ref 0.44–1.00)
GFR, Estimated: >60 mL/min (ref >60)
Glucose, Bld: 81 mg/dL (ref 70–99)
Potassium: 4 mmol/L (ref 3.5–5.1)
Sodium: 135 mmol/L (ref 135–145)

## 2020-03-29 LAB — CBC
HCT: 30.1 % — ABNORMAL LOW (ref 36.0–46.0)
Hemoglobin: 10.3 g/dL — ABNORMAL LOW (ref 12.0–15.0)
MCH: 34.8 pg — ABNORMAL HIGH (ref 26.0–34.0)
MCHC: 34.2 g/dL (ref 30.0–36.0)
MCV: 101.7 fL — ABNORMAL HIGH (ref 80.0–100.0)
Platelets: 178 10*3/uL (ref 150–400)
RBC: 2.96 MIL/uL — ABNORMAL LOW (ref 3.87–5.11)
RDW: 14.6 % (ref 11.5–15.5)
WBC: 4 10*3/uL (ref 4.0–10.5)
nRBC: 0 % (ref 0.0–0.2)

## 2020-03-29 LAB — ECHOCARDIOGRAM COMPLETE
Area-P 1/2: 2.76 cm2
Height: 64 in
S' Lateral: 2.7 cm
Weight: 3312 oz

## 2020-03-29 LAB — TROPONIN I (HIGH SENSITIVITY): Troponin I (High Sensitivity): 255 ng/L (ref <18)

## 2020-03-29 LAB — SARS CORONAVIRUS 2 (TAT 6-24 HRS): SARS Coronavirus 2: NEGATIVE

## 2020-03-29 MED ORDER — THIAMINE HCL 100 MG PO TABS: 100.0000 mg | ORAL_TABLET | Freq: Every day | ORAL | 0 refills | Status: AC

## 2020-03-29 MED ORDER — ADULT MULTIVITAMIN W/MINERALS CH: 1.0000 | ORAL_TABLET | Freq: Every day | ORAL | 0 refills | Status: AC

## 2020-03-29 MED ORDER — FOLIC ACID 1 MG PO TABS: 1.0000 mg | ORAL_TABLET | Freq: Every day | ORAL | 0 refills | Status: AC

## 2020-03-29 MED ORDER — METOPROLOL SUCCINATE ER 25 MG PO TB24
25.0000 mg | ORAL_TABLET | Freq: Every day | ORAL | 0 refills | Status: DC
Start: 2020-03-30 — End: 2020-04-05

## 2020-03-29 NOTE — Progress Notes (Signed)
Pt discharged to home, instructions reviewed with pt including d/c prescriptions. Acknowledged understanding of instructions, including follow up appointments and prescriptions. SRP, RN

## 2020-03-29 NOTE — Progress Notes (Signed)
PPE- Airborne/Contact discontinued per MD order, due to negative COVID results. SRP, RN

## 2020-03-29 NOTE — Discharge Instructions (Signed)
Resources for Substance Abuse Treatment  Outpatient Substance Use Treatment Services   [facility]  Chemical Dependence Intensive Outpatient Program [address] [place], [place] [address]  [phone] Private insurance, Medicare A&B, and [facility]   ADS (Alcohol and Drug Services)  [address],  [place], [place] [address] [phone] Medicaid, [place] [address] # [address]into [place]  [place], [place] Medicaid and [facility], Self Pay   [facility] [address]  [place], [place] [facility], and Self Pay  [facility] [place]    [place], [place] [address]  [phone] or [place] [facility] [address]  [place], [place] [phone] Medicaid, Medicare, [place] at the [facility] [address] [place], [place] [address] [phone] Services are free or reduced  [facility]  [address] [phone]  Self Pay only, sliding scale  [facility]  [address]  [place], [place] [address] [phone] ([facility]) Self Pay, Medicaid Only   [facility] [place], [place] [address] [phone] Medicaid, Medicare, [place]  Residential Substance Use Treatment Services   [facility] ([place]  [address]  [place], [place] [address]  [phone] or [phone] Detox (Medicare, Medicaid, private insurance, and self pay)  Residential Rehab 14 days (Medicare, [place], private insurance, and self pay)   RTS (Residential Treatment Services)  [place], [place]  Female and Female Detox (Self Pay and Medicaid limited availability)  Rehab only Female ([place] and self pay only)    [facility] [address]      [address]  [place], [place] [address]  [phone] or [phone] Detox and [place]  [place].  [place], [place] [address]  [phone]  Treatment Only, must make assessment appointment, and must be sober for assessment appointment.  Self Pay Only, Medicare A&B, [facility], [place] ID only! *Transportation assistance offered from [place] on [place] [place], [place] [address] Walk in interviews M-Sat 8-4p No pending legal charges [phone]     ADATC:  [facility] Referral  [address] [place], [place] (Self Pay, [facility])  [facility] [address] [place], [place] [address] [phone] Detox and Residential Treatment Medicare and [place].  [place], [place] [address] [place]: [place]: [phone] Long-term Residential Program:  [phone] Males 25 and Over (No Insurance, upfront fee)  [place], [place] [address] [phone] Private Insurance with [place], [place], [place] [address] Local ([place].  [place], [place] [address]  [phone] (Males, upfront fee)  [place] [facility] [place]  [place], [place] Locations  [facility]  [address]  [place], [place] Based Program for individuals experiencing homelessness Self Pay, No insurance  [place]  Mens program: [facility] [place],  [address]  [phone]  [facility] [place] program: [employer] [address] [place], [place]  [phone] Christian Based Program for individuals experiencing homelessness Self Pay, No insurance  [place], [place] [address]  [place] Based Program for individuals experiencing homelessness Self Pay, No insurance  [place],  [address] [address] for individuals experiencing homelessness Self Pay, No insurance  [facility] [place], [place] Based Program for males experiencing homelessness Self Pay, No insurance

## 2020-03-29 NOTE — Progress Notes (Signed)
  Echocardiogram 2D Echocardiogram has been performed.  Melanie Bradshaw 03/29/2020, 8:56 AM

## 2020-03-29 NOTE — Plan of Care (Signed)

## 2020-03-29 NOTE — TOC Transition Note (Signed)
Transition of Care [facility]) - CM/SW Discharge Note   Patient Details  Name: Melanie Bradshaw MRN: [id number] Date of Birth: Apr 27, 1965  Transition of Care [facility]) CM/SW Contact:  Ross Ludwig, LCSW Phone Number: 03/29/2020, 3:46 PM   Clinical Narrative:     Patient will be going home with home.  CSW received consult that patient may need substance abuse resources.  CSW provided list of agencies on AVS.  CSW signing off please reconsult with any other social work needs.   Final next level of care: Home/Self Care Barriers to Discharge: Barriers Resolved   Patient Goals and CMS Choice Patient states their goals for this hospitalization and ongoing recovery are:: To return back home.      Discharge Placement  Home                     Discharge Plan and Services In-house Referral: Clinical Social Work                                   Social Determinants of Health (SDOH) Interventions     Readmission Risk Interventions No flowsheet data found.

## 2020-03-29 NOTE — Discharge Summary (Signed)
Discharge Summary  Melanie Bradshaw:[id number] DOB: 1965-11-01  PCP: [name], NP  Admit date: 03/28/2020 Discharge date: 03/29/2020  Time spent: 30 mins  Recommendations for Outpatient Follow-up:  1. Cardiology as scheduled 2. PCP in 1 week    Discharge Diagnoses:  Active Hospital Problems   Diagnosis Date Noted  . Acute dyspnea 03/28/2020  . Elevated troponin   . SVT (supraventricular tachycardia) ([place])   . Tachycardia 03/28/2020  . Hyperkalemia 03/28/2020  . Alcohol use 03/28/2020  . Essential hypertension 11/24/2013    Resolved Hospital Problems  No resolved problems to display.    Discharge Condition: Stable  Diet recommendation: Heart healthy  Vitals:   03/29/20 0355 03/29/20 1343  BP: 95/71 107/74  Pulse: 64 (!) 57  Resp: 19 16  Temp: 98.2 F (36.8 C) 98.4 F (36.9 C)  SpO2: 100% 100%    History of present illness:  55 year old female with past medical history of hypertension, obesity who presented to the ED with acute onset shortness of breath while at work. Patient works at [employer] and was in her usual state of health and cooking when she had sudden onset shortness of breath and diaphoresis and headache and presyncopal. No chest pain or radiation of any symptoms. No leg swelling. Denies tobacco use but admits to marijuana and alcohol use and drinks 3-4 beers daily. Denies any history of alcohol withdrawal symptoms. No history of VTE, COPD, Asthma. Patient was noted to be tachycardic 140s-150s upon arrival with narrow complex tachycardia, elevated troponin and was given a cardizem bolus/drip and converted to NSR. In the ED, noted to be tachycardic, tachypneic,  hemodynamically stable, on room air. Notable Labs: Troponin 47-> 178, D dimer 0.64, COVID 19 neg. Notable Imaging: CXR unremarkable. CTA chest  - negative for PE, enlarged heart size, mildly ectatic ascending and descending thoracic aorta and borderline normal to minimal aneurysmal dilation  at the aortic arch. Patient received Diltiazem bolus and drip.  Patient was admitted for further management.     Today, patient denies any chest pain, shortness of breath, diaphoresis, nausea/vomiting, fever/chills.  Patient very eager to be discharged.  Discussed with patient to follow-up with cardiology and PCP as scheduled.    Hospital Course:  Principal Problem:   Acute dyspnea Active Problems:   Essential hypertension   Tachycardia   Hyperkalemia   Alcohol use   Elevated troponin   SVT (supraventricular tachycardia) (HCC)    Possible tachyarrhythmia, ?AVRT Currently normal sinus rhythm, rate controlled s/p Cardizem drip CTA chest negative for PE Cardiology consulted, recommend discharging on Toprol 25 mg daily Follow-up with cardiology  Elevated troponin EKG with no acute ST changes Echo done showed EF of 65 to 70%, no regional wall motion abnormalities Cardiology consulted, outpatient ischemic evaluation was recommended Follow-up with cardiology as scheduled  Alcohol abuse Advised to quit DC on folic acid, MVT, thiamine  Marijuana use Advised to quit  Obesity Lifestyle modification advised     Estimated body mass index is 35.53 kg/m as calculated from the following:   Height as of this encounter: 5\' 4"  (1.626 m).   Weight as of this encounter: 93.9 kg.    Procedures:  None  Consultations:  Cardiology  Discharge Exam: BP 107/74 (BP Location: Right Arm)   Pulse (!) 57   Temp 98.4 F (36.9 C)   Resp 16   Ht 5\' 4"  (1.626 m)   Wt 93.9 kg   LMP 11/06/2013   SpO2 100%   BMI 35.53  kg/m   General: NAD, noted extensive eczema on bilateral hands Cardiovascular: S1, S2 present Respiratory: CTA B     Discharge Instructions You were cared for by a hospitalist during your hospital stay. If you have any questions about your discharge medications or the care you received while you were in the hospital after you are discharged, you can call the  unit and asked to speak with the hospitalist on call if the hospitalist that took care of you is not available. Once you are discharged, your primary care physician will handle any further medical issues. Please note that NO REFILLS for any discharge medications will be authorized once you are discharged, as it is imperative that you return to your primary care physician (or establish a relationship with a primary care physician if you do not have one) for your aftercare needs so that they can reassess your need for medications and monitor your lab values.  Discharge Instructions    Diet - low sodium heart healthy   Complete by: As directed    Increase activity slowly   Complete by: As directed      Allergies as of 03/29/2020      Reactions   Tomato       Medication List    STOP taking these medications   predniSONE 20 MG tablet Commonly known as: DELTASONE     TAKE these medications   clobetasol ointment 0.05 % Commonly known as: TEMOVATE Apply 1 application topically 2 (two) times daily as needed for dry skin.   desonide 0.05 % ointment Commonly known as: DESOWEN Apply 1 application topically 2 (two) times daily as needed for dry skin.   diphenhydrAMINE 25 MG tablet Commonly known as: BENADRYL Take 50 mg by mouth daily.   eucerin cream Apply topically as needed for dry skin. What changed:   how much to take  when to take this   folic acid 1 MG tablet Commonly known as: FOLVITE Take 1 tablet (1 mg total) by mouth daily. Start taking on: March 30, 2020   hydrOXYzine 10 MG tablet Commonly known as: ATARAX/VISTARIL Take 10 mg by mouth at bedtime.   metoprolol succinate 25 MG 24 hr tablet Commonly known as: TOPROL-XL Take 1 tablet (25 mg total) by mouth daily. Start taking on: March 30, 2020   multivitamin with minerals Tabs tablet Take 1 tablet by mouth daily. Start taking on: March 30, 2020   thiamine 100 MG tablet Take 1 tablet (100 mg total) by mouth  daily. Start taking on: March 30, 2020      Allergies  Allergen Reactions  . [facility] Follow up on 04/05/2020.   Specialty: Cardiology Why: Appointment 04/05/20 at 10:00am. Please bring all medicaitons with you.  Contact information: [place] [address] [phone]       [name], NP. Schedule an appointment as soon as possible for a visit in 1 week(s).   Specialty: Internal Medicine Contact information: [place] [address] [phone]                The results of significant diagnostics from this hospitalization (including imaging, microbiology, ancillary and laboratory) are listed below for reference.    Significant Diagnostic Studies: CT Angio Chest PE W/Cm &/Or Wo Cm  Result Date: 03/28/2020 CLINICAL DATA:  Positive D-dimer. Clinical concern for pulmonary embolus. EXAM: CT ANGIOGRAPHY CHEST WITH CONTRAST  TECHNIQUE: Multidetector CT imaging of the chest was performed using the standard protocol during bolus administration of intravenous contrast. Multiplanar CT image reconstructions and MIPs were obtained to evaluate the vascular anatomy. CONTRAST:  183mL OMNIPAQUE IOHEXOL 350 MG/ML SOLN COMPARISON:  Chest radiograph from the same day FINDINGS: Cardiovascular: Satisfactory opacification of the pulmonary arteries to the segmental level. No evidence of pulmonary embolism. Enlarged heart size. No pericardial effusion. Mildly ectatic ascending and descending thoracic aorta. The aortic arch measures 3.0 cm, upper limits of normal. Mediastinum/Nodes: No enlarged mediastinal, hilar, or axillary lymph nodes. Thyroid gland, trachea, and esophagus demonstrate no significant findings. Lungs/Pleura: Lungs are clear. No pleural effusion or pneumothorax. Upper Abdomen: No acute abnormality. Musculoskeletal: No chest wall abnormality. No acute or significant osseous  findings. 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 Review of the MIP images confirms the above findings. IMPRESSION: 1. No evidence of pulmonary embolus. 2. Enlarged heart size. 3. Mildly ectatic ascending and descending thoracic aorta. 4. Borderline normal to minimal aneurysmal dilation at the aortic arch. Recommend annual imaging followup by CTA or MRA. This recommendation follows 2010 ACCF/AHA/AATS/ACR/ASA/SCA/SCAI/SIR/STS/SVM Guidelines for the Diagnosis and Management of Patients with Thoracic Aortic Disease. Circulation.2010; 121: G401-U272. Aortic aneurysm NOS (ICD10-I71.9) Electronically Signed   By: Fidela Salisbury M.D.   On: 03/28/2020 12:45   DG Chest Port 1 View  Result Date: 03/28/2020 CLINICAL DATA:  Shortness of breath. EXAM: PORTABLE CHEST 1 VIEW COMPARISON:  12/04/2018 FINDINGS: Lungs are adequately inflated and otherwise clear. Cardiomediastinal silhouette and remainder of the exam is unchanged. IMPRESSION: No active disease. Electronically Signed   By: Marin Olp M.D.   On: 03/28/2020 10:38   ECHOCARDIOGRAM COMPLETE  Result Date: 03/29/2020    ECHOCARDIOGRAM REPORT   Patient Name:   Melanie Bradshaw Date of Exam: 03/29/2020 Medical Rec #:  [id number]   Height:       64.0 in Accession #:    [id number]  Weight:       207.0 lb Date of Birth:  1965-03-22   BSA:          1.985 m Patient Age:    55 years    BP:           95/71 mmHg Patient Gender: F           HR:           70 bpm. Exam Location:  Inpatient Procedure: 2D Echo, Cardiac Doppler and Color Doppler Indications:     R94.31 Abnormal EKG, Elevated Troponin  History:         Patient has no prior history of Echocardiogram examinations.                  CHF; Risk Factors:Hypertension and Diabetes.  Sonographer:     Bernadene Person RDCS Referring Phys:  [id number] Chauncy Passy SEGAL Diagnosing Phys: Rex Kras DO IMPRESSIONS  1. Left ventricular ejection fraction, by estimation, is 65 to 70%. The left ventricle has  normal function. The left ventricle has no regional wall motion abnormalities. Left ventricular diastolic parameters were normal.  2. Right ventricular systolic function is normal. The right ventricular size is normal. There is normal pulmonary artery systolic pressure. The estimated right ventricular systolic pressure is 51.8 mmHg.  3. The mitral valve is normal in structure. Mild mitral valve regurgitation. No evidence of mitral stenosis.  4. The aortic valve is grossly normal. Aortic valve regurgitation is not visualized. Mild aortic valve sclerosis is present, with no evidence of  aortic valve stenosis. FINDINGS  Left Ventricle: Left ventricular ejection fraction, by estimation, is 65 to 70%. The left ventricle has normal function. The left ventricle has no regional wall motion abnormalities. The left ventricular internal cavity size was normal in size. There is  no left ventricular hypertrophy. Left ventricular diastolic parameters were normal. Right Ventricle: The right ventricular size is normal. No increase in right ventricular wall thickness. Right ventricular systolic function is normal. There is normal pulmonary artery systolic pressure. The tricuspid regurgitant velocity is 2.82 m/s, and  with an assumed right atrial pressure of 3 mmHg, the estimated right ventricular systolic pressure is 27.2 mmHg. Left Atrium: Left atrial size was normal in size. Right Atrium: Right atrial size was normal in size. Pericardium: There is no evidence of pericardial effusion. Mitral Valve: The mitral valve is normal in structure. Mild mitral valve regurgitation. No evidence of mitral valve stenosis. Tricuspid Valve: The tricuspid valve is normal in structure. Tricuspid valve regurgitation is mild . No evidence of tricuspid stenosis. Aortic Valve: The aortic valve is grossly normal. Aortic valve regurgitation is not visualized. Mild aortic valve sclerosis is present, with no evidence of aortic valve stenosis. Pulmonic Valve:  The pulmonic valve was grossly normal. Pulmonic valve regurgitation is trivial. No evidence of pulmonic stenosis. Aorta: The aortic root is normal in size and structure. IAS/Shunts: The atrial septum is grossly normal.  LEFT VENTRICLE PLAX 2D LVIDd:         4.96 cm  Diastology LVIDs:         2.70 cm  LV e' medial:    9.03 cm/s LV PW:         0.78 cm  LV E/e' medial:  7.2 LV IVS:        0.81 cm  LV e' lateral:   11.00 cm/s LVOT diam:     2.00 cm  LV E/e' lateral: 5.9 LV SV:         92 LV SV Index:   46 LVOT Area:     3.14 cm  RIGHT VENTRICLE RV S prime:     13.80 cm/s TAPSE (M-mode): 2.0 cm LEFT ATRIUM         Index LA diam:    3.40 cm 1.71 cm/m  AORTIC VALVE LVOT Vmax:   149.00 cm/s LVOT Vmean:  101.000 cm/s LVOT VTI:    0.293 m  AORTA Ao Root diam: 3.20 cm Ao Asc diam:  3.20 cm MITRAL VALVE               TRICUSPID VALVE MV Area (PHT): 2.76 cm    TR Peak grad:   31.8 mmHg MV Decel Time: 275 msec    TR Vmax:        282.00 cm/s MV E velocity: 65.00 cm/s MV A velocity: 58.70 cm/s  SHUNTS MV E/A ratio:  1.11        Systemic VTI:  0.29 m                            Systemic Diam: 2.00 cm Sunit Tolia DO Electronically signed by Rex Kras DO Signature Date/Time: 03/29/2020/12:28:04 PM    Final     Microbiology: Recent Results (from the past 240 hour(s))  SARS CORONAVIRUS 2 (TAT 6-24 HRS) Nasopharyngeal Nasopharyngeal Swab     Status: None   Collection Time: 03/28/20  6:21 PM   Specimen: Nasopharyngeal Swab  Result Value Ref Range Status   SARS  Coronavirus 2 NEGATIVE NEGATIVE Final    Comment: (NOTE) SARS-CoV-2 target nucleic acids are NOT DETECTED.  The SARS-CoV-2 RNA is generally detectable in upper and lower respiratory specimens during the acute phase of infection. Negative results do not preclude SARS-CoV-2 infection, do not rule out co-infections with other pathogens, and should not be used as the sole basis for treatment or other patient management decisions. Negative results must be combined  with clinical observations, patient history, and epidemiological information. The expected result is Negative.  Fact Sheet for Patients: SugarRoll.be  Fact Sheet for Healthcare Providers: https://www.woods-mathews.com/  This test is not yet approved or cleared by the [place] FDA and  has been authorized for detection and/or diagnosis of SARS-CoV-2 by FDA under an Emergency Use Authorization (EUA). This EUA will remain  in effect (meaning this test can be used) for the duration of the COVID-19 declaration under Se ction 564(b)(1) of the Act, 21 U.S.C. section 360bbb-3(b)(1), unless the authorization is terminated or revoked sooner.  Performed at [facility], [place] [address] [place], [place] [address]      Labs: Basic Metabolic Panel: Recent Labs  Lab 03/28/20 1043 03/28/20 1754 03/29/20 0348  NA 133*  --  135  K 5.2*  --  4.0  CL 99  --  104  CO2 21*  --  22  GLUCOSE 99  --  81  BUN 10  --  10  CREATININE 1.13*  --  0.78  CALCIUM 9.0  --  8.1*  MG  --  2.5*  --   PHOS  --  3.4  --    Liver Function Tests: Recent Labs  Lab 03/28/20 1043  AST 42*  ALT 24  ALKPHOS 77  BILITOT 1.5*  PROT 8.0  ALBUMIN 3.8   No results for input(s): LIPASE, AMYLASE in the last 168 hours. No results for input(s): AMMONIA in the last 168 hours. CBC: Recent Labs  Lab 03/28/20 1043 03/29/20 0348  WBC 5.1 4.0  HGB 12.0 10.3*  HCT 35.2* 30.1*  MCV 100.3* 101.7*  PLT 195 178   Cardiac Enzymes: No results for input(s): CKTOTAL, CKMB, CKMBINDEX, TROPONINI in the last 168 hours. BNP: BNP (last 3 results) No results for input(s): BNP in the last 8760 hours.  ProBNP (last 3 results) No results for input(s): PROBNP in the last 8760 hours.  CBG: No results for input(s): GLUCAP in the last 168 hours.     Signed:  Alma Friendly, MD [facility] 03/29/2020, 2:10 PM

## 2020-03-29 NOTE — Plan of Care (Signed)
  Problem: Education: Goal: Knowledge of General Education information will improve Description: Including pain rating scale, medication(s)/side effects and non-pharmacologic comfort measures 03/29/2020 1700 by Zadie Rhine, RN Outcome: Adequate for Discharge 03/29/2020 1434 by Zadie Rhine, RN Outcome: Progressing   Problem: Health Behavior/Discharge Planning: Goal: Ability to manage health-related needs will improve 03/29/2020 1700 by Zadie Rhine, RN Outcome: Adequate for Discharge 03/29/2020 1434 by Zadie Rhine, RN Outcome: Progressing   Problem: Clinical Measurements: Goal: Ability to maintain clinical measurements within normal limits will improve 03/29/2020 1700 by Zadie Rhine, RN Outcome: Adequate for Discharge 03/29/2020 1434 by Zadie Rhine, RN Outcome: Progressing Goal: Will remain free from infection Outcome: Adequate for Discharge Goal: Diagnostic test results will improve Outcome: Adequate for Discharge Goal: Respiratory complications will improve Outcome: Adequate for Discharge Goal: Cardiovascular complication will be avoided Outcome: Adequate for Discharge

## 2020-03-30 ENCOUNTER — Telehealth: Payer: Self-pay

## 2020-03-30 NOTE — Telephone Encounter (Signed)
Transition Care Management Follow-up Telephone Call  Date of discharge and from where: 03/29/2020, [facility]   How have you been since you were released from the hospital? She said she is feeling all right  Any questions or concerns? No  Items Reviewed:  Did the pt receive and understand the discharge instructions provided? Yes   Medications obtained and verified? Yes  -she had all medications and did not have any questions about her med regime.  She said she had the new medications and attempted to read the labels from the new medications - folic acid, metoprolol succinate, thiamine but was having some difficulty reading the medication names. She said she will bring the medications to her appointment for further clarification.   Other? No   Any new allergies since your discharge? No    Do you have support at home? Yes   Home Care and Equipment/Supplies: Were home health services ordered? no If so, what is the name of the agency? n/a  Has the agency set up a time to come to the patient's home? not applicable Were any new equipment or medical supplies ordered?  No What is the name of the medical supply agency? n/a Were you able to get the supplies/equipment? not applicable Do you have any questions related to the use of the equipment or supplies? No  Functional Questionnaire: (I = Independent and D = Dependent) ADLs:independent  Follow up appointments reviewed:   PCP Hospital f/u appt confirmed? Yes  Juluis Mire, NP 04/07/2020.   [facility] f/u appt confirmed? Yes  cardiology - 04/05/2020  Are transportation arrangements needed? No   If their condition worsens, is the pt aware to call PCP or go to the Emergency Dept.? Yes  Was the patient provided with contact information for the PCP's office or ED? Yes -provided her with the phone number for [facility].   Was to pt encouraged to call back with questions or concerns? Yes

## 2020-04-05 ENCOUNTER — Encounter: Payer: Self-pay | Admitting: Student

## 2020-04-05 ENCOUNTER — Other Ambulatory Visit: Payer: Self-pay

## 2020-04-05 ENCOUNTER — Ambulatory Visit: Payer: Commercial Managed Care - PPO | Admitting: Student

## 2020-04-05 VITALS — BP 90/81 | HR 80 | Ht 64.0 in | Wt 204.0 lb

## 2020-04-05 DIAGNOSIS — Z9189 Other specified personal risk factors, not elsewhere classified: Secondary | ICD-10-CM

## 2020-04-05 DIAGNOSIS — F1010 Alcohol abuse, uncomplicated: Secondary | ICD-10-CM

## 2020-04-05 DIAGNOSIS — I471 Supraventricular tachycardia: Secondary | ICD-10-CM

## 2020-04-05 DIAGNOSIS — F1290 Cannabis use, unspecified, uncomplicated: Secondary | ICD-10-CM

## 2020-04-05 DIAGNOSIS — I10 Essential (primary) hypertension: Secondary | ICD-10-CM

## 2020-04-05 MED ORDER — METOPROLOL SUCCINATE ER 25 MG PO TB24: 25.0000 mg | ORAL_TABLET | Freq: Every day | ORAL | 3 refills | Status: DC

## 2020-04-05 NOTE — Progress Notes (Unsigned)
Primary Physician/Referring:  [name], NP  Patient ID: Melanie Bradshaw, female    DOB: May 19, 1965, 55 y.o.   MRN: [id number]  Chief Complaint  Patient presents with  . SVT  . Coronary Artery Disease  . Hospitalization Follow-up   HPI:    Melanie Bradshaw  is a 55 y.o. a female with hypertension, morbid obesity, postmenopausal, marijuana smoking, and excessive alcohol use.  Denies prior history of coronary disease, PCI, CVA/TIA, transient chest of heart failure, PE/DVT.  Patient presented to [facility] emergency department concern of shortness of breath on 03/28/2020.  Work-up in the hospital revealed EKG with narrow complex tachycardia, retrograde P waves and short RP interval suggesting AVRT.  Patient converted to normal sinus rhythm with Cardizem and was initiated on Toprol-XL 25 mg p.o. daily.  Patient then maintained sinus rhythm, cardiac biomarkers trended down, and echocardiogram revealed preserved LVEF with normal diastolic function and no significant valvular disease.  Patient now presents for follow-up.  She has been doing well since discharge, without specific complaints today.  Denies chest pain, palpitations, dyspnea, dizziness, syncope, near syncope.  Denies leg swelling, orthopnea, PND.  Patient works as a Training and development officer at [employer] and has returned to work without issue.  She reports she has cut back to one-point per day smoking marijuana, and has decreased alcohol intake from 4-5 drinks per day to 3-4 drinks per day.  She does complain of left knee pain which is chronic and prohibits her activity.  Patient has no formal exercise routine, but is on her feet and walking around most of the day at work.  She has been trying to increase her physical activity, including walking to the grocery store recently without issue.  She is presently tolerating Toprol-XL.   Past Medical History:  Diagnosis Date  . Eczema 2010  . Hypertension   . Morbid obesity ([place])    Past Surgical History:   Procedure Laterality Date  . CESAREAN SECTION     Family History  Problem Relation Age of Onset  . Diabetes Mother   . Hypertension Mother   . Diabetes Sister   . Diabetes Brother     Social History   Tobacco Use  . Smoking status: Never Smoker  . Smokeless tobacco: Never Used  Substance Use Topics  . Alcohol use: No   Marital Status: Single   ROS  Review of Systems  Constitutional: Negative for malaise/fatigue and weight gain.  Cardiovascular: Negative for chest pain, claudication, leg swelling, near-syncope, orthopnea, palpitations, paroxysmal nocturnal dyspnea and syncope.  Respiratory: Negative for shortness of breath.   Hematologic/Lymphatic: Does not bruise/bleed easily.  Musculoskeletal: Positive for joint pain (left knee).  Gastrointestinal: Negative for melena.  Neurological: Negative for dizziness and weakness.    Objective  Blood pressure 90/81, pulse 80, height 5\' 4"  (1.626 m), weight 204 lb (92.5 kg), last menstrual period 11/06/2013.  Vitals with BMI 04/05/2020 03/29/2020 03/29/2020  Height 5\' 4"  - -  Weight 204 lbs - -  BMI 35 - -  Systolic 90 660 95  Diastolic 81 74 71  Pulse 80 57 64      Physical Exam Vitals reviewed.  Constitutional:      Appearance: She is obese.  HENT:     Head: Normocephalic and atraumatic.  Cardiovascular:     Rate and Rhythm: Normal rate and regular rhythm.     Pulses: Intact distal pulses.     Heart sounds: S1 normal and S2 normal. No murmur heard. No  gallop.   Pulmonary:     Effort: Pulmonary effort is normal. No respiratory distress.     Breath sounds: No wheezing, rhonchi or rales.  Abdominal:     General: Bowel sounds are normal. There is no distension.     Palpations: Abdomen is soft.  Musculoskeletal:     Right lower leg: No edema.     Left lower leg: No edema.  Skin:    General: Skin is warm and dry.  Neurological:     General: No focal deficit present.     Mental Status: She is alert and oriented to  person, place, and time.     Laboratory examination:   Recent Labs    11/25/19 1005 03/28/20 1043 03/29/20 0348  NA 137 133* 135  K 4.3 5.2* 4.0  CL 102 99 104  CO2 22 21* 22  GLUCOSE 93 99 81  BUN 14 10 10   CREATININE 0.76 1.13* 0.78  CALCIUM 9.2 9.0 8.1*  GFRNONAA 89 58* >60  GFRAA 103  --   --    estimated creatinine clearance is 88.6 mL/min (by C-G formula based on SCr of 0.78 mg/dL).  CMP Latest Ref Rng & Units 03/29/2020 03/28/2020 11/25/2019  Glucose 70 - 99 mg/dL 81 99 93  BUN 6 - 20 mg/dL 10 10 14   Creatinine 0.44 - 1.00 mg/dL 0.78 1.13(H) 0.76  Sodium 135 - 145 mmol/L 135 133(L) 137  Potassium 3.5 - 5.1 mmol/L 4.0 5.2(H) 4.3  Chloride 98 - 111 mmol/L 104 99 102  CO2 22 - 32 mmol/L 22 21(L) 22  Calcium 8.9 - 10.3 mg/dL 8.1(L) 9.0 9.2  Total Protein 6.5 - 8.1 g/dL - 8.0 7.2  Total Bilirubin 0.3 - 1.2 mg/dL - 1.5(H) 0.4  Alkaline Phos 38 - 126 U/L - 77 115  AST 15 - 41 U/L - 42(H) 26  ALT 0 - 44 U/L - 24 18   CBC Latest Ref Rng & Units 03/29/2020 03/28/2020 11/25/2019  WBC 4.0 - 10.5 K/uL 4.0 5.1 5.6  Hemoglobin 12.0 - 15.0 g/dL 10.3(L) 12.0 12.8  Hematocrit 36.0 - 46.0 % 30.1(L) 35.2(L) 38.3  Platelets 150 - 400 K/uL 178 195 221    Lipid Panel Recent Labs    03/28/20 1252  CHOL 219*  TRIG 80  LDLCALC 91  VLDL 16  HDL 112  CHOLHDL 2.0    HEMOGLOBIN A1C Lab Results  Component Value Date   HGBA1C 5.3 06/20/2019   MPG 120 (H) 12/18/2012   TSH No results for input(s): TSH in the last 8760 hours.  External labs:   None  Medications and allergies   Allergies  Allergen Reactions  . Tomato      Outpatient Medications Prior to Visit  Medication Sig Dispense Refill  . clobetasol ointment (TEMOVATE) 0.93 % Apply 1 application topically 2 (two) times daily as needed for dry skin.    Marland Kitchen desonide (DESOWEN) 0.05 % ointment Apply 1 application topically 2 (two) times daily as needed for dry skin.    Marland Kitchen diphenhydrAMINE (BENADRYL) 25 MG tablet Take 50  mg by mouth daily.    . folic acid (FOLVITE) 1 MG tablet Take 1 tablet (1 mg total) by mouth daily. 30 tablet 0  . hydrOXYzine (ATARAX/VISTARIL) 10 MG tablet Take 10 mg by mouth at bedtime.    . Multiple Vitamin (MULTIVITAMIN WITH MINERALS) TABS tablet Take 1 tablet by mouth daily. 30 tablet 0  . Skin Protectants, Misc. (EUCERIN) cream Apply topically as needed  for dry skin. (Patient taking differently: Apply 1 application topically daily as needed for dry skin.) 454 g 0  . thiamine 100 MG tablet Take 1 tablet (100 mg total) by mouth daily. 30 tablet 0  . metoprolol succinate (TOPROL-XL) 25 MG 24 hr tablet Take 1 tablet (25 mg total) by mouth daily. 30 tablet 0   No facility-administered medications prior to visit.     Radiology:   No results found.  Cardiac Studies:   Echocardiogram 03/29/2020: 1. Left ventricular ejection fraction, by estimation, is 65 to 70%. The  left ventricle has normal function. The left ventricle has no regional  wall motion abnormalities. Left ventricular diastolic parameters were  normal.  2. Right ventricular systolic function is normal. The right ventricular  size is normal. There is normal pulmonary artery systolic pressure. The  estimated right ventricular systolic pressure is 16.1 mmHg.  3. The mitral valve is normal in structure. Mild mitral valve  regurgitation. No evidence of mitral stenosis.  4. The aortic valve is grossly normal. Aortic valve regurgitation is not  visualized. Mild aortic valve sclerosis is present, with no evidence of  aortic valve stenosis.  EKG:   EKG 04/05/2020: Sinus rhythm at a rate of 65 bpm.  Left atrial enlargement.  Left axis, left anterior fascicular block.  Poor R wave progression, cannot exclude anterior septal infarct old.  Nonspecific T wave abnormality.  03/28/2020 12:21: Sinus rhythm at a rate of 78 bpm.  Normal axis.  Poor R wave progression, cannot exclude anteroseptal infarct old.  No evidence of ischemia or  injury pattern.   03/28/2020 959: Narrow complex tachycardia suggestive of AVRT, 134 bpm, normal axis, without underlying ischemia or injury pattern.  Assessment     ICD-10-CM   1. SVT (supraventricular tachycardia) (HCC)  I47.1 EKG 12-Lead    PCV MYOCARDIAL PERFUSION WO LEXISCAN  2. Primary hypertension  I10 PCV MYOCARDIAL PERFUSION WO LEXISCAN  3. Encounter for screening for coronary artery disease in patient with risk for coronary artery disease between 10% and 20% in next 10 years  Z13.6 PCV MYOCARDIAL PERFUSION WO LEXISCAN   Z91.89   4. Marijuana use, continuous  F12.90   5. Alcohol abuse  F10.10      Medications Discontinued During This Encounter  Medication Reason  . metoprolol succinate (TOPROL-XL) 25 MG 24 hr tablet Reorder    Meds ordered this encounter  Medications  . metoprolol succinate (TOPROL-XL) 25 MG 24 hr tablet    Sig: Take 1 tablet (25 mg total) by mouth daily.    Dispense:  90 tablet    Refill:  3    Recommendations:   Melanie Bradshaw is a 55 y.o. a female with hypertension, morbid obesity, postmenopausal, marijuana smoking, and excessive alcohol use.  Denies prior history of coronary disease, PCI, CVA/TIA, transient chest of heart failure, PE/DVT.  Presented to [facility] emergency department 03/28/2020 with EKG suggestive of AVRT, now maintaining sinus rhythm with Toprol-XL.  Overall patient is feeling well, is only asymptomatic without clinical signs of heart failure.  EKG today reveals sinus rhythm at a rate of 65 bpm. Patient's blood pressure is soft, however she is asymptomatic.  Total cholesterol is elevated, discussed at length with patient regarding diet and lifestyle modifications in order to reduce cardiovascular risk.  Counseled patient regarding the need to discontinue marijuana smoking as well as continue to reduce alcohol intake.  Patient verbalized understanding and agreement.  I have congratulated her on her efforts thus far and  encouraged her to  continue to make positive changes.   In view of tacky arrhythmia as well as multiple cardiovascular risk factors, recommend patient undergo ischemic evaluation.  We will proceed with nuclear stress test.  We will also continue metoprolol succinate 25 mg daily as she is tolerating this well despite soft blood pressure.  Encourage patient to continue to increase physical activity and focus on weight loss.  Follow-up in 6 weeks, sooner if needed, for results of cardiac testing.  During this visit I reviewed and updated: Tobacco history  allergies medication reconciliation  medical history  surgical history  family history  social history.  This note was created using a voice recognition software as a result there may be grammatical errors inadvertently enclosed that do not reflect the nature of this encounter. Every attempt is made to correct such errors.   Alethia Berthold, PA-C 04/06/2020, 8:43 AM Office: [phone]

## 2020-04-07 ENCOUNTER — Other Ambulatory Visit: Payer: Self-pay

## 2020-04-07 ENCOUNTER — Ambulatory Visit (INDEPENDENT_AMBULATORY_CARE_PROVIDER_SITE_OTHER): Payer: Commercial Managed Care - PPO | Admitting: Primary Care

## 2020-04-07 ENCOUNTER — Encounter (INDEPENDENT_AMBULATORY_CARE_PROVIDER_SITE_OTHER): Payer: Self-pay | Admitting: Primary Care

## 2020-04-07 VITALS — BP 120/82 | HR 59 | Temp 97.5°F | Ht 64.0 in | Wt 202.2 lb

## 2020-04-07 DIAGNOSIS — Z1211 Encounter for screening for malignant neoplasm of colon: Secondary | ICD-10-CM

## 2020-04-07 DIAGNOSIS — Z09 Encounter for follow-up examination after completed treatment for conditions other than malignant neoplasm: Secondary | ICD-10-CM | POA: Diagnosis not present

## 2020-04-07 NOTE — Progress Notes (Signed)
HPI  [name] is a  55 y.o.female presents for follow up from the hospital. Admit date to the hospital was 03/28/20, patient was discharged from the hospital on 03/29/20, patient was admitted for: presented to the ED with acute onset shortness of breath SVT (supraventricular tachycardia) ([place]).  Hospital problem list included essential hypertension, acute dyspnea, tachycardia, hyperkalemia, alcohol use and elevated troponin. She is being followed by cardiology.  She voices no problems or concerns at this time    Past Medical History:  Diagnosis Date  . Eczema 2010  . Hypertension   . Morbid obesity (HCC)      Allergies  Allergen Reactions  . Tomato       Current Outpatient Medications on File Prior to Visit  Medication Sig Dispense Refill  . clobetasol ointment (TEMOVATE) 4.08 % Apply 1 application topically 2 (two) times daily as needed for dry skin.    Marland Kitchen desonide (DESOWEN) 0.05 % ointment Apply 1 application topically 2 (two) times daily as needed for dry skin.    Marland Kitchen diphenhydrAMINE (BENADRYL) 25 MG tablet Take 50 mg by mouth daily.    . folic acid (FOLVITE) 1 MG tablet Take 1 tablet (1 mg total) by mouth daily. 30 tablet 0  . hydrOXYzine (ATARAX/VISTARIL) 10 MG tablet Take 10 mg by mouth at bedtime.    . metoprolol succinate (TOPROL-XL) 25 MG 24 hr tablet Take 1 tablet (25 mg total) by mouth daily. 90 tablet 3  . Multiple Vitamin (MULTIVITAMIN WITH MINERALS) TABS tablet Take 1 tablet by mouth daily. 30 tablet 0  . Skin Protectants, Misc. (EUCERIN) cream Apply topically as needed for dry skin. (Patient taking differently: Apply 1 application topically daily as needed for dry skin.) 454 g 0  . thiamine 100 MG tablet Take 1 tablet (100 mg total) by mouth daily. 30 tablet 0   No current facility-administered medications on file prior to visit.    ROS:  Review of Systems  All other systems reviewed and are negative.  Physical Exam: BP 120/82 (BP Location: Right Arm, Patient  Position: Sitting, Cuff Size: Normal)   Pulse (!) 59   Temp (!) 97.5 F (36.4 C) (Temporal)   Ht 5\' 4"  (1.626 m)   Wt 202 lb 3.2 oz (91.7 kg)   LMP 11/06/2013   SpO2 96%   BMI 34.71 kg/m  LMP 11/06/2013  General Appearance: Well nourished, morbid obese in no apparent distress. Eyes: PERRLA, EOMs, conjunctiva no swelling or erythema Sinuses: No Frontal/maxillary tenderness ENT/Mouth: Ext aud canals clear, TMs without erythema. Hearing normal.  Neck: Supple,thick,  thyroid normal.  Respiratory: Respiratory effort normal, BS equal bilaterally without rales, rhonchi, wheezing or stridor.  Cardio: RRR with no MRGs. Brisk peripheral pulses without edema.  Abdomen: Soft, + BS.  Non tender, no guarding, rebound, hernias, masses. Lymphatics: Non tender without lymphadenopathy.  Musculoskeletal: Full ROM, 5/5 strength, normal gait.  Skin: Warm, dry without rashes, lesions, ecchymosis.  Neuro: Cranial nerves intact. Normal muscle tone, no cerebellar symptoms. Sensation intact.  Psych: Awake and oriented X 3, normal affect, Insight and Judgment appropriate.    Melanie Bradshaw was seen today for hospitalization follow-up.  Diagnoses and all orders for this visit:  Colon cancer screening -     Ambulatory referral to Gastroenterology  Hospital discharge follow-up Discharge instructions retrieved from discharge 1 Follow up with [facility] (Cardiology) on 04/05/2020; Appointment 04/05/20 at 10:00am. Please bring all medicaitons with you. 2 Schedule an appointment with [name], NP (  Internal Medicine) in 1 week (04/05/2020   Reviewed hospital encounter labs and imaging [name], NP

## 2020-04-12 ENCOUNTER — Other Ambulatory Visit: Payer: Self-pay

## 2020-04-12 ENCOUNTER — Ambulatory Visit: Payer: Commercial Managed Care - PPO

## 2020-04-12 DIAGNOSIS — Z136 Encounter for screening for cardiovascular disorders: Secondary | ICD-10-CM

## 2020-04-12 DIAGNOSIS — I471 Supraventricular tachycardia: Secondary | ICD-10-CM

## 2020-04-12 DIAGNOSIS — Z9189 Other specified personal risk factors, not elsewhere classified: Secondary | ICD-10-CM

## 2020-04-12 DIAGNOSIS — I10 Essential (primary) hypertension: Secondary | ICD-10-CM

## 2020-04-14 NOTE — Progress Notes (Signed)
Called and spoke with pt regarding stress test results. Pt voiced understanding.

## 2020-04-14 NOTE — Progress Notes (Signed)
Please inform patient her stress test was low risk.

## 2020-05-12 ENCOUNTER — Other Ambulatory Visit (INDEPENDENT_AMBULATORY_CARE_PROVIDER_SITE_OTHER): Payer: Self-pay | Admitting: Primary Care

## 2020-05-12 DIAGNOSIS — L249 Irritant contact dermatitis, unspecified cause: Secondary | ICD-10-CM

## 2020-05-12 NOTE — Telephone Encounter (Signed)
Requested medications are due for refill today.  Unknown  Requested medications are on the active medications list.  unknown  Last refill. unknown  Future visit scheduled.   yes  Notes to clinic.  Medication not delegated. Medication was d/c'd 03/29/2020

## 2020-05-18 ENCOUNTER — Encounter: Payer: Self-pay | Admitting: Student

## 2020-05-18 ENCOUNTER — Other Ambulatory Visit: Payer: Self-pay

## 2020-05-18 ENCOUNTER — Ambulatory Visit: Payer: Commercial Managed Care - PPO | Admitting: Student

## 2020-05-18 VITALS — BP 111/76 | HR 66 | Temp 98.5°F | Resp 17 | Ht 64.0 in | Wt 189.8 lb

## 2020-05-18 DIAGNOSIS — I471 Supraventricular tachycardia: Secondary | ICD-10-CM

## 2020-05-18 DIAGNOSIS — I10 Essential (primary) hypertension: Secondary | ICD-10-CM

## 2020-05-18 MED ORDER — CLOBETASOL PROPIONATE 0.05 % EX OINT: 1.0000 "application " | TOPICAL_OINTMENT | Freq: Two times a day (BID) | CUTANEOUS | 0 refills | Status: DC | PRN

## 2020-05-18 MED ORDER — DESONIDE 0.05 % EX OINT: 1.0000 "application " | TOPICAL_OINTMENT | Freq: Two times a day (BID) | CUTANEOUS | 0 refills | Status: DC | PRN

## 2020-05-18 NOTE — Progress Notes (Signed)
Primary Physician/Referring:  [name], NP  Patient ID: Melanie Bradshaw, female    DOB: 08-Dec-1965, 55 y.o.   MRN: [id number]  Chief Complaint  Patient presents with  . Results    6 WEEKS   HPI:    Melanie Bradshaw  is a 55 y.o. a female with hypertension, morbid obesity, postmenopausal, marijuana smoking, and excessive alcohol use.  Denies prior history of coronary disease, PCI, CVA/TIA, transient chest of heart failure, PE/DVT.  Patient presented to [facility] emergency department concern of shortness of breath on 03/28/2020.  Work-up in the hospital revealed EKG with narrow complex tachycardia, retrograde P waves and short RP interval suggesting AVRT.  Patient converted to normal sinus rhythm with Cardizem and was initiated on Toprol-XL 25 mg p.o. daily.  Patient then maintained sinus rhythm, cardiac biomarkers trended down, and echocardiogram revealed preserved LVEF with normal diastolic function and no significant valvular disease.  Patient presents for 6-week follow-up for results of cardiac testing.  At last visit ordered nuclear stress test and encourage patient to focus on diet and lifestyle modification.  Lexiscan nuclear stress test revealed normal LVEF, low risk.  Well without specific complaints today.Denies chest pain, palpitations, dyspnea, dizziness, syncope, near syncope.  Denies leg swelling, orthopnea, PND.   Patient is congratulated as she has worked to cut back on both smoking marijuana and alcohol intake, however unfortunately she does continue to smoke and drink alcohol on daily basis.  She continues to tolerate metoprolol succinate 25 mg daily without issue.  Primary concern today is that she is from out of appointment for her skin, states she has appointment with PCP in 1 month, but needs refill to get her through until this appointment.  Past Medical History:  Diagnosis Date  . Eczema 2010  . Hypertension   . Morbid obesity ([place])    Past Surgical History:   Procedure Laterality Date  . CESAREAN SECTION     Family History  Problem Relation Age of Onset  . Diabetes Mother   . Hypertension Mother   . Diabetes Sister   . Diabetes Brother     Social History   Tobacco Use  . Smoking status: Never Smoker  . Smokeless tobacco: Never Used  Substance Use Topics  . Alcohol use: Yes    Alcohol/week: 3.0 standard drinks    Types: 3 Cans of beer per week    Comment: SOCIAL   Marital Status: Single   ROS  Review of Systems  Constitutional: Negative for malaise/fatigue and weight gain.  Cardiovascular: Negative for chest pain, claudication, leg swelling, near-syncope, orthopnea, palpitations, paroxysmal nocturnal dyspnea and syncope.  Respiratory: Negative for shortness of breath.   Hematologic/Lymphatic: Does not bruise/bleed easily.  Musculoskeletal: Positive for joint pain (left knee).  Gastrointestinal: Negative for melena.  Neurological: Negative for dizziness and weakness.    Objective  Blood pressure 111/76, pulse 66, temperature 98.5 F (36.9 C), temperature source Temporal, resp. rate 17, height 5\' 4"  (1.626 m), weight 189 lb 12.8 oz (86.1 kg), last menstrual period 11/06/2013, SpO2 97 %.  Vitals with BMI 05/18/2020 04/07/2020 04/05/2020  Height 5\' 4"  5\' 4"  5\' 4"   Weight 189 lbs 13 oz 202 lbs 3 oz 204 lbs  BMI 24.58 09.98 35  Systolic 338 250 90  Diastolic 76 82 81  Pulse 66 59 80      Physical Exam Vitals reviewed.  Constitutional:      Appearance: She is obese.  HENT:     Head: Normocephalic  and atraumatic.  Cardiovascular:     Rate and Rhythm: Normal rate and regular rhythm.     Pulses: Intact distal pulses.     Heart sounds: S1 normal and S2 normal. No murmur heard. No gallop.   Pulmonary:     Effort: Pulmonary effort is normal. No respiratory distress.     Breath sounds: No wheezing, rhonchi or rales.  Musculoskeletal:     Right lower leg: No edema.     Left lower leg: No edema.  Skin:    General: Skin is warm  and dry.  Neurological:     General: No focal deficit present.     Mental Status: She is alert and oriented to person, place, and time.     Laboratory examination:   Recent Labs    11/25/19 1005 03/28/20 1043 03/29/20 0348  NA 137 133* 135  K 4.3 5.2* 4.0  CL 102 99 104  CO2 22 21* 22  GLUCOSE 93 99 81  BUN 14 10 10   CREATININE 0.76 1.13* 0.78  CALCIUM 9.2 9.0 8.1*  GFRNONAA 89 58* >60  GFRAA 103  --   --    CrCl cannot be calculated (Patient's most recent lab result is older than the maximum 21 days allowed.).  CMP Latest Ref Rng & Units 03/29/2020 03/28/2020 11/25/2019  Glucose 70 - 99 mg/dL 81 99 93  BUN 6 - 20 mg/dL 10 10 14   Creatinine 0.44 - 1.00 mg/dL 0.78 1.13(H) 0.76  Sodium 135 - 145 mmol/L 135 133(L) 137  Potassium 3.5 - 5.1 mmol/L 4.0 5.2(H) 4.3  Chloride 98 - 111 mmol/L 104 99 102  CO2 22 - 32 mmol/L 22 21(L) 22  Calcium 8.9 - 10.3 mg/dL 8.1(L) 9.0 9.2  Total Protein 6.5 - 8.1 g/dL - 8.0 7.2  Total Bilirubin 0.3 - 1.2 mg/dL - 1.5(H) 0.4  Alkaline Phos 38 - 126 U/L - 77 115  AST 15 - 41 U/L - 42(H) 26  ALT 0 - 44 U/L - 24 18   CBC Latest Ref Rng & Units 03/29/2020 03/28/2020 11/25/2019  WBC 4.0 - 10.5 K/uL 4.0 5.1 5.6  Hemoglobin 12.0 - 15.0 g/dL 10.3(L) 12.0 12.8  Hematocrit 36.0 - 46.0 % 30.1(L) 35.2(L) 38.3  Platelets 150 - 400 K/uL 178 195 221    Lipid Panel Recent Labs    03/28/20 1252  CHOL 219*  TRIG 80  LDLCALC 91  VLDL 16  HDL 112  CHOLHDL 2.0    HEMOGLOBIN A1C Lab Results  Component Value Date   HGBA1C 5.3 06/20/2019   MPG 120 (H) 12/18/2012   TSH No results for input(s): TSH in the last 8760 hours.  External labs:   None  Medications and allergies   Allergies  Allergen Reactions  . Tomato      Outpatient Medications Prior to Visit  Medication Sig Dispense Refill  . diphenhydrAMINE (BENADRYL) 25 MG tablet Take 50 mg by mouth daily.    . folic acid (FOLVITE) 1 MG tablet Take 1 mg by mouth daily.    . hydrOXYzine  (ATARAX/VISTARIL) 10 MG tablet Take 10 mg by mouth at bedtime.    . metoprolol succinate (TOPROL-XL) 25 MG 24 hr tablet Take 1 tablet (25 mg total) by mouth daily. 90 tablet 3  . Skin Protectants, Misc. (EUCERIN) cream Apply topically as needed for dry skin. (Patient taking differently: Apply 1 application topically daily as needed for dry skin.) 454 g 0  . thiamine (VITAMIN B-1) 100 MG  tablet Take 100 mg by mouth daily.    . clobetasol ointment (TEMOVATE) 5.17 % Apply 1 application topically 2 (two) times daily as needed for dry skin.    Marland Kitchen desonide (DESOWEN) 0.05 % ointment Apply 1 application topically 2 (two) times daily as needed for dry skin.     No facility-administered medications prior to visit.     Radiology:   No results found.  Cardiac Studies:  PCV MYOCARDIAL PERFUSION WO LEXISCAN 04/12/2020 Nondiagnostic ECG stress. Myocardial perfusion is abnormal. Prominent breast tissue attenuation noted. in inferior wall. Scar or ischemic is less likely. Overall LV systolic function is normal without regional wall motion abnormalities. Stress LV EF: 62%. No previous exam available for comparison. Low risk.  Echocardiogram 03/29/2020: 1. Left ventricular ejection fraction, by estimation, is 65 to 70%. The  left ventricle has normal function. The left ventricle has no regional  wall motion abnormalities. Left ventricular diastolic parameters were  normal.  2. Right ventricular systolic function is normal. The right ventricular  size is normal. There is normal pulmonary artery systolic pressure. The  estimated right ventricular systolic pressure is 61.6 mmHg.  3. The mitral valve is normal in structure. Mild mitral valve  regurgitation. No evidence of mitral stenosis.  4. The aortic valve is grossly normal. Aortic valve regurgitation is not  visualized. Mild aortic valve sclerosis is present, with no evidence of  aortic valve stenosis.  EKG:   EKG 04/05/2020: Sinus rhythm at a rate  of 65 bpm.  Left atrial enlargement.  Left axis, left anterior fascicular block.  Poor R wave progression, cannot exclude anterior septal infarct old.  Nonspecific T wave abnormality.  03/28/2020 12:21: Sinus rhythm at a rate of 78 bpm.  Normal axis.  Poor R wave progression, cannot exclude anteroseptal infarct old.  No evidence of ischemia or injury pattern.   03/28/2020 959: Narrow complex tachycardia suggestive of AVRT, 134 bpm, normal axis, without underlying ischemia or injury pattern.  Assessment     ICD-10-CM   1. Primary hypertension  I10   2. SVT (supraventricular tachycardia) (HCC)  I47.1      Medications Discontinued During This Encounter  Medication Reason  . clobetasol ointment (TEMOVATE) 0.05 % Reorder  . desonide (DESOWEN) 0.05 % ointment Reorder    Meds ordered this encounter  Medications  . clobetasol ointment (TEMOVATE) 0.05 %    Sig: Apply 1 application topically 2 (two) times daily as needed.    Dispense:  30 g    Refill:  0  . desonide (DESOWEN) 0.05 % ointment    Sig: Apply 1 application topically 2 (two) times daily as needed.    Dispense:  15 g    Refill:  0    Recommendations:   Melanie Bradshaw is a 55 y.o. a female with hypertension, morbid obesity, postmenopausal, marijuana smoking, and excessive alcohol use.  Denies prior history of coronary disease, PCI, CVA/TIA, transient chest of heart failure, PE/DVT.  Presented to [facility] emergency department 03/28/2020 with EKG suggestive of AVRT, now maintaining sinus rhythm with Toprol-XL.  Patient presents for 6-week follow-up for results of cardiac testing.  At last visit ordered nuclear stress test and encourage patient to focus on diet and lifestyle modification.  Lexiscan nuclear stress test revealed normal LVEF, low risk.  Reviewed and discussed with patient regarding results of stress test, details above.  Patient's blood pressure and lipids are well controlled.  She continues to make efforts to reduce  marijuana smoking and alcohol intake.  We will continue metoprolol succinate 25 mg daily.  Encourage patient to continue to increase physical activity and focus on weight loss.  I have refilled patient's skin cream at her request until she follows up with PCP next month.   Follow-up in 6 months, sooner if needed, for tachycardia.   Alethia Berthold, PA-C 05/21/2020, 8:36 AM Office: [phone]

## 2020-07-08 ENCOUNTER — Other Ambulatory Visit (HOSPITAL_COMMUNITY)
Admission: RE | Admit: 2020-07-08 | Discharge: 2020-07-08 | Disposition: A | Discharge status: Home / Self Care | Payer: Commercial Managed Care - PPO | Source: Ambulatory Visit | Attending: Primary Care | Admitting: Primary Care

## 2020-07-08 ENCOUNTER — Ambulatory Visit (INDEPENDENT_AMBULATORY_CARE_PROVIDER_SITE_OTHER): Payer: Commercial Managed Care - PPO | Admitting: Primary Care

## 2020-07-08 ENCOUNTER — Encounter (INDEPENDENT_AMBULATORY_CARE_PROVIDER_SITE_OTHER): Payer: Self-pay | Admitting: Primary Care

## 2020-07-08 ENCOUNTER — Other Ambulatory Visit: Payer: Self-pay

## 2020-07-08 VITALS — BP 106/75 | HR 71 | Temp 97.5°F | Ht 64.0 in | Wt 182.6 lb

## 2020-07-08 DIAGNOSIS — Z124 Encounter for screening for malignant neoplasm of cervix: Secondary | ICD-10-CM

## 2020-07-08 DIAGNOSIS — Z113 Encounter for screening for infections with a predominantly sexual mode of transmission: Secondary | ICD-10-CM

## 2020-07-08 DIAGNOSIS — Z131 Encounter for screening for diabetes mellitus: Secondary | ICD-10-CM | POA: Diagnosis not present

## 2020-07-08 DIAGNOSIS — Z1211 Encounter for screening for malignant neoplasm of colon: Secondary | ICD-10-CM

## 2020-07-08 LAB — POCT GLYCOSYLATED HEMOGLOBIN (HGB A1C): Hemoglobin A1C: 4.8 % (ref 4.0–5.6)

## 2020-07-08 NOTE — Progress Notes (Signed)
WELL-WOMAN PHYSICAL & PAP Patient name: Melanie Bradshaw MRN [id number]  Date of birth: 05-24-1965 Chief Complaint:   Gynecologic Exam  History of Present Illness:   Melanie Bradshaw is a 55 y.o. No obstetric history on file. female being seen today for a routine well-woman exam.  Current complaints: none   PCP: [name]       does not desire labs Patient's last menstrual period was 11/06/2013. The current method of family planning is none.  Last colonoscopy: ordered GI consult . Results were:  Review of Systems:   Pertinent items are noted in HPI Denies any headaches, blurred vision, fatigue, shortness of breath, chest pain, abdominal pain, abnormal vaginal discharge/itching/odor/irritation, problems with periods, bowel movements, urination, or intercourse unless otherwise stated above. Pertinent History Reviewed:  Reviewed past medical,surgical, social and family history.  Reviewed problem list, medications and allergies. Physical Assessment:   Vitals:   07/08/20 1503  BP: 106/75  Pulse: 71  Temp: (!) 97.5 F (36.4 C)  TempSrc: Temporal  SpO2: 97%  Weight: 182 lb 9.6 oz (82.8 kg)  Height: 5\' 4"  (1.626 m)  Body mass index is 31.34 kg/m.        Physical Examination:   General appearance - well appearing, and in no distress  Mental status - alert, oriented to person, place, and time  Psych:  She has a normal mood and affect  Skin - warm and dry, normal color, no suspicious lesions noted  Chest - effort normal, all lung fields clear to auscultation bilaterally  Heart - normal rate and regular rhythm  Neck:  midline trachea, no thyromegaly or nodules  Breasts - breasts appear normal, no suspicious masses, no skin or nipple changes or axillary nodes   Educated patient on proper self breast examination and had patient to demonstrate SBE.  Abdomen - soft, nontender, nondistended, no masses or organomegaly  Pelvic - VULVA: normal appearing vulva with no masses, tenderness or  lesions   VAGINA: normal appearing vagina with normal color and discharge, no lesions   CERVIX: normal appearing cervix without discharge or lesions, no CMT   Thin prep pap is done    UTERUS: uterus is felt to be normal size, shape, consistency and nontender    ADNEXA: No adnexal masses or tenderness noted.  Extremities:  No swelling or varicosities noted  Results for orders placed or performed in visit on 07/08/20 (from the past 24 hour(s))  HgB A1c   Collection Time: 07/08/20  3:22 PM  Result Value Ref Range   Hemoglobin A1C 4.8 4.0 - 5.6 %   HbA1c POC (<> result, manual entry)     HbA1c, POC (prediabetic range)     HbA1c, POC (controlled diabetic range)      Assessment & Plan:  1) Well-Woman Exam completed  Pap  Cervical cancer screening -     Cytology - PAP([place])   2) STD screening  Screening examination for STD (sexually transmitted disease) -     Cervicovaginal ancillary only  Screening for diabetes mellitus -     HgB A1c 4.8 Per ADA guidelines patient is not a diabetic   Colon cancer screening -     Ambulatory referral to Gastroenterology   Labs/procedures today: none  Mammogram schedule July 2022  or sooner if problems Colonoscopy ordered  or sooner if problems  Orders Placed This Encounter  Procedures   Ambulatory referral to Gastroenterology   HgB A1c    Meds: No orders of the defined  types were placed in this encounter.   Follow-up: Return in about 1 year (around 07/08/2021), or if symptoms worsen or fail to improve, for annual visit .  Juluis Mire, NP 07/08/2020 3:44 PM

## 2020-07-08 NOTE — Patient Instructions (Signed)

## 2020-07-08 NOTE — Progress Notes (Deleted)
Patient ID: Melanie Bradshaw, female    DOB: [birth year]  MRN: [id number]   Annual Wellness Visit  Melanie Bradshaw is a 55 y.o. Female who presents for an Annual Wellness Visit. ? Patient Active Problem List   Diagnosis Date Noted   Elevated troponin    SVT (supraventricular tachycardia) (HCC)    Acute dyspnea 03/28/2020   Tachycardia 03/28/2020   Hyperkalemia 03/28/2020   Alcohol use 03/28/2020   Essential hypertension 11/24/2013   UTI (urinary tract infection) 05/19/2013   Contact dermatitis 05/19/2013   HTN (hypertension) 05/19/2013    Current Outpatient Medications on File Prior to Visit  Medication Sig Dispense Refill   clobetasol ointment (TEMOVATE) 1.77 % Apply 1 application topically 2 (two) times daily as needed. 30 g 0   desonide (DESOWEN) 0.05 % ointment Apply 1 application topically 2 (two) times daily as needed. 15 g 0   diphenhydrAMINE (BENADRYL) 25 MG tablet Take 50 mg by mouth daily.     folic acid (FOLVITE) 1 MG tablet Take 1 mg by mouth daily.     hydrOXYzine (ATARAX/VISTARIL) 10 MG tablet Take 10 mg by mouth at bedtime.     metoprolol succinate (TOPROL-XL) 25 MG 24 hr tablet Take 1 tablet (25 mg total) by mouth daily. 90 tablet 3   Skin Protectants, Misc. (EUCERIN) cream Apply topically as needed for dry skin. (Patient taking differently: Apply 1 application topically daily as needed for dry skin.) 454 g 0   thiamine (VITAMIN B-1) 100 MG tablet Take 100 mg by mouth daily.     No current facility-administered medications on file prior to visit.    Allergies  Allergen Reactions   Tomato      Health Risk Assessment The patient has completed a Health Risk Assessment. This has been reviewed with the patient and has been scanned into the [facility] system as a separate document.   Current Medical Providers and Suppliers The providers who are involved in the care of this patient are listed above. Additional providers and suppliers are listed below:   Age-appropriate  Screening Schedule Refer to the list in the Health Maintenance section for an age appropriated screening completed by this patient. Additional screening recommendations are listed below in the plan section. The patient has been provided with a written plan.    Health Maintenance Due  Topic Date Due   OPHTHALMOLOGY EXAM  Never done   URINE MICROALBUMIN  Never done   COLONOSCOPY (Pts 45-39yrs Insurance coverage will need to be confirmed)  Never done   MAMMOGRAM  06/22/2015   Zoster Vaccines- Shingrix (1 of 2) Never done   PAP SMEAR-Modifier  02/27/2016   COVID-19 Vaccine (3 - Booster for Pfizer series) 04/09/2020    Depression Screen Over the past two weeks have you:     Felt down or depressed? {yes/no:311199}     Had little interest or pleasure in doing things? {yes/no:311199}     depression    Functional Ability/Safety Screen 1. Falls Risk: Does the patient need assistance with ambulation? {yes/no:311199} Does the patient have a history of a fall in the last 90 days? {yes/no:311199} Is the patient at risk for falls? {yes/no:311199} Was the patient's timed "Get Up and Go Test" unsteady or longer than 30 seconds? {yes/no (PQRS-154):21228}.   2. Does the patient need help with: Ron Parker index)         Bathing: {yes/no:311199}         Dressing : {yes/no:311199}  Toileting: {yes/no:311199}         Transferring: {yes/no:311199}         Continence: {yes/no:311199}         Feeding: {yes/no:311199}           3. Does the home have:         Rugs in the hallway: {yes/no:311199}         Grab bars in the bathroom: {yes/no:311199}         Handrails on the stairs:{yes/no:311199}         Stairs in home: {yes/no:311199}         Poor lightning: {yes/no:311199}           Hearing Evaluation:     Do you have trouble hearing the television when others do not? {yes/no:311199}     Do you have to strain to hear/understand conversations? {yes/no:311199}  Advanced Care Planning     Patient  has executed an Advance Directive: {yes/no:311199}     If no, patient was given the opportunity to execute an Advance Directive today? {yes/no:31119}     This patient has the ability to prepare an Advance Directive: {yes/no:311199}     Provider is willing to follow the patient's wishes: {yes/no:311199}      Cognitive Assessment: Does the patient have evidence of cognitive impairment? {yes no free text:20080} The patient {ACTIONS; HAS/DOES NOT HAVE:19233} evidence of a change in mood/affect, appearance, speech, memory or motor skills.   Identification of Risk Factors: Risk factors include: {MISC; WELLNESS RISK FACTORS:20653}   ? PHYSICAL EXAM: Vitals:   07/08/20 1503  BP: 106/75  Pulse: 71  Temp: (!) 97.5 F (36.4 C)  TempSrc: Temporal  SpO2: 97%  Weight: 182 lb 9.6 oz (82.8 kg)  Height: 5\' 4"  (1.626 m)   Body mass index is 31.34 kg/m. {female adult master:310786} {female adult master:310785}    ASSESSMENT AND PLAN: Patient Self-Management and Personalized Health Advice The patient has been provided with information about: {MISC; PERSONALIZED HEALTH ADVICE:20654}   During the course of the visit the patient was educated and counseled about appropriate screening and preventive services including:            {plan:19836::"***"}        Discussed the patient's BMI with her. The BMI {BMI plan (MU NQF measure 421):19504}   Orders placed during this encounter include: Orders Placed This Encounter  Procedures   HgB A1c     No follow-ups on file.   ? An after visit summary with all of these plans was given to the patient.

## 2020-07-11 LAB — CERVICOVAGINAL ANCILLARY ONLY
Bacterial Vaginitis (gardnerella): NEGATIVE
Candida Glabrata: NEGATIVE
Candida Vaginitis: NEGATIVE
Chlamydia: NEGATIVE
Comment: NEGATIVE
Comment: NEGATIVE
Comment: NEGATIVE
Comment: NEGATIVE
Comment: NEGATIVE
Comment: NORMAL
Neisseria Gonorrhea: NEGATIVE
Trichomonas: NEGATIVE

## 2020-07-14 LAB — CYTOLOGY - PAP: Diagnosis: NEGATIVE

## 2020-07-16 ENCOUNTER — Encounter: Payer: Self-pay | Admitting: *Deleted

## 2020-07-16 NOTE — Progress Notes (Signed)
No answer.  Will mail letter.   The results from your recent visit are back and your labs have been reviewed.   All lab results are normal or stable. Please view your MyChart or call our office if you have any questions or concerns.   Please don't hesitate to call.  Sincerely,  [employer]

## 2020-08-12 ENCOUNTER — Other Ambulatory Visit: Payer: Self-pay

## 2020-08-12 ENCOUNTER — Ambulatory Visit
Admission: RE | Admit: 2020-08-12 | Discharge: 2020-08-12 | Disposition: A | Discharge status: Home / Self Care | Payer: Commercial Managed Care - PPO | Source: Ambulatory Visit | Attending: Primary Care | Admitting: Primary Care

## 2020-08-12 DIAGNOSIS — Z1231 Encounter for screening mammogram for malignant neoplasm of breast: Secondary | ICD-10-CM

## 2020-11-16 ENCOUNTER — Ambulatory Visit: Payer: Commercial Managed Care - PPO | Admitting: Student

## 2020-11-16 NOTE — Progress Notes (Deleted)
Primary Physician/Referring:  [name], NP  Patient ID: Melanie Bradshaw, female    DOB: [birth year], 55 y.o.   MRN: [id number]  No chief complaint on file.  HPI:    Melanie Bradshaw  is a 55 y.o. a female with hypertension, morbid obesity, postmenopausal, marijuana smoking, and excessive alcohol use.  Denies prior history of coronary disease, PCI, CVA/TIA, transient chest of heart failure, PE/DVT.  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 daily.  Patient has since maintained sinus rhythm.  Patient presents for 44-month follow-up.  Last office visit stress test was low risk and patient was stable from cardiovascular standpoint, therefore no changes were made.***  ***  Patient presented to [facility] emergency department concern of shortness of breath on 03/28/2020.  Work-up in the hospital revealed EKG with narrow complex tachycardia, retrograde P waves and short RP interval suggesting AVRT.  Patient converted to normal sinus rhythm with Cardizem and was initiated on Toprol-XL 25 mg p.o. daily.  Patient then maintained sinus rhythm, cardiac biomarkers trended down, and echocardiogram revealed preserved LVEF with normal diastolic function and no significant valvular disease.  Patient presents for 6-week follow-up for results of cardiac testing.  At last visit ordered nuclear stress test and encourage patient to focus on diet and lifestyle modification.  Lexiscan nuclear stress test revealed normal LVEF, low risk.  Well without specific complaints today.Denies chest pain, palpitations, dyspnea, dizziness, syncope, near syncope.  Denies leg swelling, orthopnea, PND.   Patient is congratulated as she has worked to cut back on both smoking marijuana and alcohol intake, however unfortunately she does continue to smoke and drink alcohol on daily basis.  She continues to tolerate metoprolol succinate 25 mg  daily without issue.  Primary concern today is that she is from out of appointment for her skin, states she has appointment with PCP in 1 month, but needs refill to get her through until this appointment.  Past Medical History:  Diagnosis Date   Eczema 2010   Hypertension    Morbid obesity ([place])    Past Surgical History:  Procedure Laterality Date   CESAREAN SECTION     Family History  Problem Relation Age of Onset   Diabetes Mother    Hypertension Mother    Diabetes Sister    Diabetes Brother     Social History   Tobacco Use   Smoking status: Never   Smokeless tobacco: Never  Substance Use Topics   Alcohol use: Yes    Alcohol/week: 3.0 standard drinks    Types: 3 Cans of beer per week    Comment: SOCIAL   Marital Status: Single   ROS  Review of Systems  Constitutional: Negative for malaise/fatigue and weight gain.  Cardiovascular:  Negative for chest pain, claudication, leg swelling, near-syncope, orthopnea, palpitations, paroxysmal nocturnal dyspnea and syncope.  Respiratory:  Negative for shortness of breath.   Hematologic/Lymphatic: Does not bruise/bleed easily.  Musculoskeletal:  Positive for joint pain (left knee).  Gastrointestinal:  Negative for melena.  Neurological:  Negative for dizziness and weakness.   Objective  Last menstrual period 11/06/2013.  Vitals with BMI 07/08/2020 05/18/2020 04/07/2020  Height 5\' 4"  5\' 4"  5\' 4"   Weight 182 lbs 10 oz 189 lbs 13 oz 202 lbs 3 oz  BMI 31.33 78.24 23.53  Systolic 614 431 540  Diastolic 75 76 82  Pulse 71 66 59  Physical Exam Vitals reviewed.  Constitutional:      Appearance: She is obese.  HENT:     Head: Normocephalic and atraumatic.  Cardiovascular:     Rate and Rhythm: Normal rate and regular rhythm.     Pulses: Intact distal pulses.     Heart sounds: S1 normal and S2 normal. No murmur heard.   No gallop.  Pulmonary:     Effort: Pulmonary effort is normal. No respiratory distress.     Breath  sounds: No wheezing, rhonchi or rales.  Musculoskeletal:     Right lower leg: No edema.     Left lower leg: No edema.  Skin:    General: Skin is warm and dry.  Neurological:     General: No focal deficit present.     Mental Status: She is alert and oriented to person, place, and time.    Laboratory examination:   Recent Labs    11/25/19 1005 03/28/20 1043 03/29/20 0348  NA 137 133* 135  K 4.3 5.2* 4.0  CL 102 99 104  CO2 22 21* 22  GLUCOSE 93 99 81  BUN 14 10 10   CREATININE 0.76 1.13* 0.78  CALCIUM 9.2 9.0 8.1*  GFRNONAA 89 58* >60  GFRAA 103  --   --     CrCl cannot be calculated (Patient's most recent lab result is older than the maximum 21 days allowed.).  CMP Latest Ref Rng & Units 03/29/2020 03/28/2020 11/25/2019  Glucose 70 - 99 mg/dL 81 99 93  BUN 6 - 20 mg/dL 10 10 14   Creatinine 0.44 - 1.00 mg/dL 0.78 1.13(H) 0.76  Sodium 135 - 145 mmol/L 135 133(L) 137  Potassium 3.5 - 5.1 mmol/L 4.0 5.2(H) 4.3  Chloride 98 - 111 mmol/L 104 99 102  CO2 22 - 32 mmol/L 22 21(L) 22  Calcium 8.9 - 10.3 mg/dL 8.1(L) 9.0 9.2  Total Protein 6.5 - 8.1 g/dL - 8.0 7.2  Total Bilirubin 0.3 - 1.2 mg/dL - 1.5(H) 0.4  Alkaline Phos 38 - 126 U/L - 77 115  AST 15 - 41 U/L - 42(H) 26  ALT 0 - 44 U/L - 24 18   CBC Latest Ref Rng & Units 03/29/2020 03/28/2020 11/25/2019  WBC 4.0 - 10.5 K/uL 4.0 5.1 5.6  Hemoglobin 12.0 - 15.0 g/dL 10.3(L) 12.0 12.8  Hematocrit 36.0 - 46.0 % 30.1(L) 35.2(L) 38.3  Platelets 150 - 400 K/uL 178 195 221    Lipid Panel Recent Labs    03/28/20 1252  CHOL 219*  TRIG 80  LDLCALC 91  VLDL 16  HDL 112  CHOLHDL 2.0     HEMOGLOBIN A1C Lab Results  Component Value Date   HGBA1C 4.8 07/08/2020   MPG 120 (H) 12/18/2012   TSH No results for input(s): TSH in the last 8760 hours.  External labs:   None Allergies   Allergies  Allergen Reactions   Tomato       Medications Prior to Visit:   Outpatient Medications Prior to Visit  Medication Sig  Dispense Refill   clobetasol ointment (TEMOVATE) 1.30 % Apply 1 application topically 2 (two) times daily as needed. 30 g 0   desonide (DESOWEN) 0.05 % ointment Apply 1 application topically 2 (two) times daily as needed. 15 g 0   diphenhydrAMINE (BENADRYL) 25 MG tablet Take 50 mg by mouth daily.     folic acid (FOLVITE) 1 MG tablet Take 1 mg by mouth daily.     hydrOXYzine (ATARAX/VISTARIL) 10 MG  tablet Take 10 mg by mouth at bedtime.     metoprolol succinate (TOPROL-XL) 25 MG 24 hr tablet Take 1 tablet (25 mg total) by mouth daily. 90 tablet 3   Skin Protectants, Misc. (EUCERIN) cream Apply topically as needed for dry skin. (Patient taking differently: Apply 1 application topically daily as needed for dry skin.) 454 g 0   thiamine (VITAMIN B-1) 100 MG tablet Take 100 mg by mouth daily.     No facility-administered medications prior to visit.   Final Medications at End of Visit    No outpatient medications have been marked as taking for the 11/16/20 encounter (Appointment) with Rayetta Pigg, Amador Braddy C, PA-C.   Radiology:   No results found.  Cardiac Studies:   PCV MYOCARDIAL PERFUSION WO LEXISCAN 04/12/2020 Nondiagnostic ECG stress. Myocardial perfusion is abnormal. Prominent breast tissue attenuation noted. in inferior wall. Scar or ischemic is less likely. Overall LV systolic function is normal without regional wall motion abnormalities. Stress LV EF: 62%. No previous exam available for comparison. Low risk.  Echocardiogram 03/29/2020: 1. Left ventricular ejection fraction, by estimation, is 65 to 70%. The  left ventricle has normal function. The left ventricle has no regional  wall motion abnormalities. Left ventricular diastolic parameters were  normal.   2. Right ventricular systolic function is normal. The right ventricular  size is normal. There is normal pulmonary artery systolic pressure. The  estimated right ventricular systolic pressure is 29.9 mmHg.   3. The mitral valve  is normal in structure. Mild mitral valve  regurgitation. No evidence of mitral stenosis.   4. The aortic valve is grossly normal. Aortic valve regurgitation is not  visualized. Mild aortic valve sclerosis is present, with no evidence of  aortic valve stenosis.  EKG:   EKG 04/05/2020: Sinus rhythm at a rate of 65 bpm.  Left atrial enlargement.  Left axis, left anterior fascicular block.  Poor R wave progression, cannot exclude anterior septal infarct old.  Nonspecific T wave abnormality.   03/28/2020 12:21: Sinus rhythm at a rate of 78 bpm.  Normal axis.  Poor R wave progression, cannot exclude anteroseptal infarct old.  No evidence of ischemia or injury pattern.   03/28/2020 959: Narrow complex tachycardia suggestive of AVRT, 134 bpm, normal axis, without underlying ischemia or injury pattern.   Assessment   No diagnosis found.    There are no discontinued medications.   No orders of the defined types were placed in this encounter.   Recommendations:   Melanie Bradshaw is a 55 y.o. a female with hypertension, morbid obesity, postmenopausal, marijuana smoking, and excessive alcohol use.  Denies prior history of coronary disease, PCI, CVA/TIA, transient chest of heart failure, PE/DVT.  Presented to [facility] emergency department 03/28/2020 with EKG suggestive of AVRT, now maintaining sinus rhythm with Toprol-XL.   Patient presents for 57-month follow-up.  Last office visit stress test was low risk and patient was stable from cardiovascular standpoint, therefore no changes were made.***  ***  Patient presents for 6-week follow-up for results of cardiac testing.  At last visit ordered nuclear stress test and encourage patient to focus on diet and lifestyle modification.  Lexiscan nuclear stress test revealed normal LVEF, low risk.  Reviewed and discussed with patient regarding results of stress test, details above.  Patient's blood pressure and lipids are well controlled.  She continues to make  efforts to reduce marijuana smoking and alcohol intake.  We will continue metoprolol succinate 25 mg daily.  Encourage patient to continue  to increase physical activity and focus on weight loss.  I have refilled patient's skin cream at her request until she follows up with PCP next month.   Follow-up in 6 months, sooner if needed, for tachycardia.   Melanie Berthold, PA-C 11/16/2020, 8:43 AM Office: [phone]

## 2020-11-17 ENCOUNTER — Ambulatory Visit: Payer: Commercial Managed Care - PPO | Admitting: Student

## 2021-04-30 ENCOUNTER — Encounter (HOSPITAL_COMMUNITY): Payer: Self-pay

## 2021-04-30 ENCOUNTER — Emergency Department (HOSPITAL_COMMUNITY)
Admission: EM | Admit: 2021-04-30 | Discharge: 2021-04-30 | Disposition: A | Discharge status: Home / Self Care | Payer: Commercial Managed Care - PPO | Attending: Emergency Medicine | Admitting: Emergency Medicine

## 2021-04-30 ENCOUNTER — Other Ambulatory Visit: Payer: Self-pay

## 2021-04-30 ENCOUNTER — Emergency Department (HOSPITAL_COMMUNITY): Payer: Commercial Managed Care - PPO

## 2021-04-30 DIAGNOSIS — M5442 Lumbago with sciatica, left side: Secondary | ICD-10-CM | POA: Diagnosis not present

## 2021-04-30 DIAGNOSIS — M5136 Other intervertebral disc degeneration, lumbar region: Secondary | ICD-10-CM | POA: Insufficient documentation

## 2021-04-30 DIAGNOSIS — M545 Low back pain, unspecified: Secondary | ICD-10-CM | POA: Diagnosis present

## 2021-04-30 DIAGNOSIS — M51369 Other intervertebral disc degeneration, lumbar region without mention of lumbar back pain or lower extremity pain: Secondary | ICD-10-CM

## 2021-04-30 MED ORDER — ACETAMINOPHEN 500 MG PO TABS
1000.0000 mg | ORAL_TABLET | Freq: Once | ORAL | Status: AC
  Administered 2021-04-30: 1000 mg via ORAL
  Filled 2021-04-30: qty 2

## 2021-04-30 MED ORDER — TRAMADOL HCL 50 MG PO TABS: 50.0000 mg | ORAL_TABLET | Freq: Four times a day (QID) | ORAL | 0 refills | Status: DC | PRN

## 2021-04-30 MED ORDER — PREDNISONE 20 MG PO TABS: ORAL_TABLET | ORAL | 0 refills | Status: DC

## 2021-04-30 MED ORDER — TRAMADOL HCL 50 MG PO TABS
50.0000 mg | ORAL_TABLET | Freq: Once | ORAL | Status: AC
  Administered 2021-04-30: 50 mg via ORAL
  Filled 2021-04-30: qty 1

## 2021-04-30 MED ORDER — PREDNISONE 20 MG PO TABS
60.0000 mg | ORAL_TABLET | Freq: Once | ORAL | Status: AC
  Administered 2021-04-30: 60 mg via ORAL
  Filled 2021-04-30: qty 3

## 2021-04-30 NOTE — ED Triage Notes (Signed)
EMS reports coming from work, acute left sided back since last night, increasing this morning at work. Decreased urine output per Pt. ? ?BP 112/70 ?HR 74 ?RR 16 ?Sp02 100 RA ? ?20ga RAC ?100 Fentanyl enroute. ?57m NS ?

## 2021-04-30 NOTE — ED Provider Notes (Signed)
?[place] DEPT ?Provider Note ? ? ?CSN: [id number] ?Arrival date & time: 04/30/21  0705 ? ?  ? ?History ? ?Chief Complaint  ?Patient presents with  ? Back Pain  ? ? ?Melanie Bradshaw is a 56 y.o. female. ? ?Pt c/o pain to left low back radiating towards left buttock/leg. Symptoms acute onset in past day, moderate, dull to sharp/radiating. No saddle area numbness. No leg numbness or weakness. No problems w normal bowel/bladder control. No dysuria or hematuria. Denies specific back injury or strain. No fever or chills. No anterior pain, no abd or pelvic pain. No skin changes, redness or lesions in area of pain.  ? ?The history is provided by the patient, medical records and the EMS personnel.  ?Flank Pain ?Pertinent negatives include no chest pain, no abdominal pain, no headaches and no shortness of breath.  ? ?  ? ?Home Medications ?Prior to Admission medications   ?Medication Sig Start Date End Date Taking? Authorizing Provider  ?clobetasol ointment (TEMOVATE) 3.57 % Apply 1 application topically 2 (two) times daily as needed. 05/18/20   Cantwell, Celeste C, PA-C  ?desonide (DESOWEN) 0.05 % ointment Apply 1 application topically 2 (two) times daily as needed. 05/18/20   Cantwell, Celeste C, PA-C  ?diphenhydrAMINE (BENADRYL) 25 MG tablet Take 50 mg by mouth daily.    [provider]  ?folic acid (FOLVITE) 1 MG tablet Take 1 mg by mouth daily.    [provider]  ?hydrOXYzine (ATARAX/VISTARIL) 10 MG tablet Take 10 mg by mouth at bedtime. 02/23/20   [provider]  ?metoprolol succinate (TOPROL-XL) 25 MG 24 hr tablet Take 1 tablet (25 mg total) by mouth daily. 04/05/20 03/31/21  Cantwell, Gerline Legacy, PA-C  ?Skin Protectants, Misc. (EUCERIN) cream Apply topically as needed for dry skin. ?Patient taking differently: Apply 1 application topically daily as needed for dry skin. 11/25/19   [name], NP  ?thiamine (VITAMIN B-1) 100 MG tablet Take 100 mg by mouth daily.     [provider]  ?   ? ?Allergies    ?Tomato   ? ?Review of Systems   ?Review of Systems  ?Constitutional:  Negative for chills and fever.  ?Eyes:  Negative for redness.  ?Respiratory:  Negative for cough and shortness of breath.   ?Cardiovascular:  Negative for chest pain and leg swelling.  ?Gastrointestinal:  Negative for abdominal pain, diarrhea and vomiting.  ?Genitourinary:  Negative for dysuria, hematuria and pelvic pain.  ?Musculoskeletal:  Positive for back pain.  ?Skin:  Negative for rash.  ?Neurological:  Negative for weakness, numbness and headaches.  ?Hematological:  Does not bruise/bleed easily.  ?Psychiatric/Behavioral:  Negative for confusion.   ? ?Physical Exam ?Updated Vital Signs ?BP 108/72   Pulse 64   Temp 98.6 ?F (37 ?C) (Oral)   Resp 16   LMP 11/06/2013   SpO2 100%  ?Physical Exam ?Vitals and nursing note reviewed.  ?Constitutional:   ?   Appearance: Normal appearance. She is well-developed.  ?HENT:  ?   Head: Atraumatic.  ?   Nose: Nose normal.  ?   Mouth/Throat:  ?   Mouth: Mucous membranes are moist.  ?Eyes:  ?   General: No scleral icterus. ?   Conjunctiva/sclera: Conjunctivae normal.  ?Neck:  ?   Trachea: No tracheal deviation.  ?Cardiovascular:  ?   Rate and Rhythm: Normal rate and regular rhythm.  ?   Pulses: Normal pulses.  ?   Heart sounds: Normal heart sounds.  No murmur heard. ?  No friction rub. No gallop.  ?Pulmonary:  ?   Effort: Pulmonary effort is normal. No respiratory distress.  ?   Breath sounds: Normal breath sounds.  ?Abdominal:  ?   General: Bowel sounds are normal. There is no distension.  ?   Palpations: Abdomen is soft. There is no mass.  ?   Tenderness: There is no abdominal tenderness. There is no guarding.  ?Genitourinary: ?   Comments: No cva tenderness.  ?Musculoskeletal:     ?   General: No swelling.  ?   Cervical back: Normal range of motion and neck supple. No rigidity. No muscular tenderness.  ?   Comments: Mid lumbar tenderness, otherwise, CTLS  spine, non tender, aligned, no step off. Tenderness left sciatic notch area.  ?Good rom bil ext without pain or focal bony tenderness. Distal pulses palp bil.  ?Skin: ?   General: Skin is warm and dry.  ?   Findings: No rash.  ?   Comments: No rash/lesions in area of pain.   ?Neurological:  ?   Mental Status: She is alert.  ?   Comments: Alert, speech normal. Motor/sens grossly intact bil, stre 5/5. Steady gait.   ?Psychiatric:     ?   Mood and Affect: Mood normal.  ? ? ?ED Results / Procedures / Treatments   ?Labs ?(all labs ordered are listed, but only abnormal results are displayed) ?Labs Reviewed - No data to display ? ?EKG ?None ? ?Radiology ?DG Lumbar Spine Complete ? ?Result Date: 04/30/2021 ?CLINICAL DATA:  Left-sided back pain. EXAM: LUMBAR SPINE - COMPLETE 4+ VIEW COMPARISON:  None. FINDINGS: Four views study. No fracture or subluxation evident. Marked facet degeneration noted lower lumbar levels loss of disc space at L4-5 and L5-S1. SI joints unremarkable. 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 No acute findings. Electronically Signed   By: Misty Stanley M.D.   On: 04/30/2021 09:11   ? ?Procedures ?Procedures  ? ? ?Medications Ordered in ED ?Medications  ?predniSONE (DELTASONE) tablet 60 mg (has no administration in time range)  ?traMADol (ULTRAM) tablet 50 mg (has no administration in time range)  ?acetaminophen (TYLENOL) tablet 1,000 mg (has no administration in time range)  ? ? ?ED Course/ Medical Decision Making/ A&P ?  ?                        ?Medical Decision Making ?Problems Addressed: ?Acute left-sided low back pain with left-sided sciatica: acute illness or injury ?Lumbar degenerative disc disease: acute illness or injury ? ?Amount and/or Complexity of Data Reviewed ?External Data Reviewed: notes. ?Radiology: ordered and independent interpretation performed. Decision-making details documented in ED Course. ? ?Risk ?OTC drugs. ?Prescription drug management. ? ?Imaging  ordered.  ? ?Reviewed nursing notes and prior charts for additional history. External reports reviewed. Additional hx by EMS. ? ?Xrays reviewed/interpreted by me - degen changes.  ? ?Prednisone po, ultram po, acetaminophen po. ? ?Recheck pain improved.  ? ? ? ? ? ? ? ? ? ?Final Clinical Impression(s) / ED Diagnoses ?Final diagnoses:  ?None  ? ? ?Rx / DC Orders ?ED Discharge Orders   ? ? None  ? ?  ? ? ?  ?Lajean Saver, MD ?04/30/21 0935 ? ?

## 2021-04-30 NOTE — Discharge Instructions (Addendum)
It was our pleasure to provide your ER care today - we hope that you feel better. ? ?Avoid bending at waist or heavy lifting > 20 lbs for the next week. Heat therapy to sore area.  ? ?Take prednisone as prescribed. Take acetaminophen as need for pain. You may also take ultram as need for pain - no driving when taking. ? ?Follow up with primary care doctor in one week if symptoms fail to improve/resolve. ? ?Return to ER if worse, new symptoms, numbness/weakness, fevers, urinary retention/incontinence, or other concern.  ?

## 2021-05-02 ENCOUNTER — Encounter (HOSPITAL_COMMUNITY): Payer: Self-pay | Admitting: Radiology

## 2021-05-04 ENCOUNTER — Emergency Department (HOSPITAL_COMMUNITY)
Admission: EM | Admit: 2021-05-04 | Discharge: 2021-05-04 | Disposition: A | Discharge status: Home / Self Care | Payer: Commercial Managed Care - PPO | Attending: Emergency Medicine | Admitting: Emergency Medicine

## 2021-05-04 ENCOUNTER — Encounter (HOSPITAL_COMMUNITY): Payer: Self-pay | Admitting: Emergency Medicine

## 2021-05-04 DIAGNOSIS — M5442 Lumbago with sciatica, left side: Secondary | ICD-10-CM | POA: Diagnosis present

## 2021-05-04 DIAGNOSIS — M5432 Sciatica, left side: Secondary | ICD-10-CM

## 2021-05-04 MED ORDER — MORPHINE SULFATE (PF) 4 MG/ML IV SOLN
10.0000 mg | Freq: Once | INTRAVENOUS | Status: AC
  Administered 2021-05-04: 4 mg via INTRAMUSCULAR
  Filled 2021-05-04: qty 3

## 2021-05-04 MED ORDER — ONDANSETRON HCL 4 MG/2ML IJ SOLN
4.0000 mg | Freq: Once | INTRAMUSCULAR | Status: AC
  Administered 2021-05-04: 4 mg via INTRAVENOUS
  Filled 2021-05-04: qty 2

## 2021-05-04 MED ORDER — DIAZEPAM 2 MG PO TABS: 2.0000 mg | ORAL_TABLET | Freq: Four times a day (QID) | ORAL | 0 refills | Status: DC | PRN

## 2021-05-04 MED ORDER — CELECOXIB 200 MG PO CAPS: 200.0000 mg | ORAL_CAPSULE | Freq: Two times a day (BID) | ORAL | 0 refills | Status: DC

## 2021-05-04 MED ORDER — OXYCODONE HCL 5 MG PO TABS: 2.5000 mg | ORAL_TABLET | Freq: Four times a day (QID) | ORAL | 0 refills | Status: DC | PRN

## 2021-05-04 MED ORDER — DIAZEPAM 5 MG/ML IJ SOLN
2.5000 mg | Freq: Once | INTRAMUSCULAR | Status: AC
  Administered 2021-05-04: 2.5 mg via INTRAVENOUS
  Filled 2021-05-04: qty 2

## 2021-05-04 NOTE — Discharge Instructions (Signed)
SEEK IMMEDIATE MEDICAL ATTENTION IF: New numbness, tingling, weakness, or problem with the use of your arms or legs.  Severe back pain not relieved with medications.  Change in bowel or bladder control.  Increasing pain in any areas of the body (such as chest or abdominal pain).  Shortness of breath, dizziness or fainting.  Nausea (feeling sick to your stomach), vomiting, fever, or sweats.  

## 2021-05-04 NOTE — ED Notes (Signed)
I provided reinforced discharge education based off of discharge instructions. Pt acknowledged and understood my education. Pt had no further questions/concerns for provider/myself.  °

## 2021-05-04 NOTE — ED Triage Notes (Signed)
Per pt, states she was seen and treated on 4/1-states the medicine she was prescribed is not helping-states no incontinence of bowel of bladder-unable to sleep due to pain as well as decreased mobility ?

## 2021-05-04 NOTE — ED Provider Notes (Signed)
?[place] DEPT ?Provider Note ? ? ?CSN: [id number] ?Arrival date & time: 05/04/21  0815 ? ?  ? ?History ? ?Chief Complaint  ?Patient presents with  ? Back Pain  ? ? ?Melanie Bradshaw is a 56 y.o. female who returns to the emergency department with severe back pain.  She was seen for the same complaint 4 days ago.  She had sudden onset of severe left-sided low back pain radiating down the leg.  She was seen and assessed with a lumbar film that showed degenerative changes, diagnosed with acute left-sided low back pain with sciatica, given prednisone, tramadol and Tylenol.  She returns because she has not been able to sleep.  The pain is constant, severe.  She states that the pain is worse when she tries to move out of any position.  She has been using a wheelchair because her pain is so severe when she tries to stand.  He denies saddle anesthesia, bowel or bladder incontinence, weakness of the lower extremity. ? ? ?Back Pain ? ?  ? ?Home Medications ?Prior to Admission medications   ?Medication Sig Start Date End Date Taking? Authorizing Provider  ?clobetasol ointment (TEMOVATE) 0.10 % Apply 1 application topically 2 (two) times daily as needed. 05/18/20   Cantwell, Celeste C, PA-C  ?desonide (DESOWEN) 0.05 % ointment Apply 1 application topically 2 (two) times daily as needed. 05/18/20   Cantwell, Celeste C, PA-C  ?diphenhydrAMINE (BENADRYL) 25 MG tablet Take 50 mg by mouth daily.    [provider]  ?folic acid (FOLVITE) 1 MG tablet Take 1 mg by mouth daily.    [provider]  ?hydrOXYzine (ATARAX/VISTARIL) 10 MG tablet Take 10 mg by mouth at bedtime. 02/23/20   [provider]  ?metoprolol succinate (TOPROL-XL) 25 MG 24 hr tablet Take 1 tablet (25 mg total) by mouth daily. 04/05/20 03/31/21  Cantwell, Celeste C, PA-C  ?predniSONE (DELTASONE) 20 MG tablet 3 po once a day for 2 days, then 2 po once a day for 3 days, then 1 po once a day for 3 days 05/01/21   Lajean Saver,  MD  ?Skin Protectants, Misc. (EUCERIN) cream Apply topically as needed for dry skin. ?Patient taking differently: Apply 1 application topically daily as needed for dry skin. 11/25/19   [name], NP  ?thiamine (VITAMIN B-1) 100 MG tablet Take 100 mg by mouth daily.    [provider]  ?traMADol (ULTRAM) 50 MG tablet Take 1 tablet (50 mg total) by mouth every 6 (six) hours as needed. 04/30/21   Lajean Saver, MD  ?   ? ?Allergies    ?Tomato   ? ?Review of Systems   ?Review of Systems  ?Musculoskeletal:  Positive for back pain.  ? ?Physical Exam ?Updated Vital Signs ?BP (!) 146/97 (BP Location: Left Arm)   Pulse 63   Temp 98.2 ?F (36.8 ?C) (Oral)   Resp 18   LMP 11/06/2013   SpO2 100%  ?Physical Exam ?Vitals and nursing note reviewed.  ?Constitutional:   ?   General: She is not in acute distress. ?   Appearance: She is well-developed. She is not diaphoretic.  ?   Comments: Patient appears to be in pain, tearful, crying.  ?HENT:  ?   Head: Normocephalic and atraumatic.  ?   Right Ear: External ear normal.  ?   Left Ear: External ear normal.  ?   Nose: Nose normal.  ?   Mouth/Throat:  ?   Mouth:  Mucous membranes are moist.  ?Eyes:  ?   General: No scleral icterus. ?   Conjunctiva/sclera: Conjunctivae normal.  ?Cardiovascular:  ?   Rate and Rhythm: Normal rate and regular rhythm.  ?   Heart sounds: Normal heart sounds. No murmur heard. ?  No friction rub. No gallop.  ?Pulmonary:  ?   Effort: Pulmonary effort is normal. No respiratory distress.  ?   Breath sounds: Normal breath sounds.  ?Abdominal:  ?   General: Bowel sounds are normal. There is no distension.  ?   Palpations: Abdomen is soft. There is no mass.  ?   Tenderness: There is no abdominal tenderness. There is no guarding.  ?Musculoskeletal:  ?   Cervical back: Normal range of motion.  ?   Comments: Obvious pain and spasm to palpation in the left lumbar paraspinal and left QL region.  ?Skin: ?   General: Skin is warm and dry.   ?Neurological:  ?   Mental Status: She is alert and oriented to person, place, and time.  ?Psychiatric:     ?   Behavior: Behavior normal.  ? ? ?ED Results / Procedures / Treatments   ?Labs ?(all labs ordered are listed, but only abnormal results are displayed) ?Labs Reviewed - No data to display ? ?EKG ?None ? ?Radiology ?No results found. ? ?Procedures ?Procedures  ? ? ?Medications Ordered in ED ?Medications  ?ondansetron (ZOFRAN) injection 4 mg (4 mg Intravenous Given 05/04/21 0949)  ?morphine (PF) 4 MG/ML injection 10 mg (10 mg Intramuscular Given 05/04/21 0953)  ?diazepam (VALIUM) injection 2.5 mg (2.5 mg Intravenous Given 05/04/21 0950)  ? ? ?ED Course/ Medical Decision Making/ A&P ?  ?                        ?Medical Decision Making ?Patient returns for symptoms of sciatica.  She has no red flag symptoms concerning for cauda equina, no foot drop or weakness in the lower extremity.  It is predominantly appears to be about pain control.  I do feel there is a significant component of spasm in the lumbar spine.  Patient given IM morphine, and Valium with significant relief of her symptoms.  She is able to tolerate movement and is no longer tearful and resting comfortably.  I reviewed the PDMP and will discharge the patient with increased pain control.  She is advised to follow with her primary care physician in case she needs longer time off.  Discussed outpatient follow-up and return precautions with the patient who appears appropriate for discharge at this time ? ?Risk ?Prescription drug management. ? ?Final Clinical Impression(s) / ED Diagnoses ?Final diagnoses:  ?None  ? ? ?Rx / DC Orders ?ED Discharge Orders   ? ? None  ? ?  ? ? ?  ?Margarita Mail, PA-C ?05/04/21 1607 ? ?  ?Regan Lemming, MD ?05/04/21 1647 ? ?

## 2021-05-11 ENCOUNTER — Encounter (INDEPENDENT_AMBULATORY_CARE_PROVIDER_SITE_OTHER): Payer: Self-pay | Admitting: Primary Care

## 2021-05-11 ENCOUNTER — Ambulatory Visit (INDEPENDENT_AMBULATORY_CARE_PROVIDER_SITE_OTHER): Payer: Commercial Managed Care - PPO | Admitting: Primary Care

## 2021-05-11 VITALS — BP 98/67 | HR 78 | Temp 98.0°F | Ht 64.0 in | Wt 171.2 lb

## 2021-05-11 DIAGNOSIS — M5440 Lumbago with sciatica, unspecified side: Secondary | ICD-10-CM | POA: Diagnosis not present

## 2021-05-11 DIAGNOSIS — Z09 Encounter for follow-up examination after completed treatment for conditions other than malignant neoplasm: Secondary | ICD-10-CM

## 2021-05-11 MED ORDER — CELECOXIB 200 MG PO CAPS
200.0000 mg | ORAL_CAPSULE | Freq: Two times a day (BID) | ORAL | 2 refills | Status: DC
Start: 2021-05-11 — End: 2022-04-24

## 2021-05-11 NOTE — Progress Notes (Signed)
?[place] ? ? ?Subjective:  ? [name] is a 56 y.o. female presents for emergency room follow up . She presented on 04/30/21 and  05/04/21, with back pain on left low back radiating towards left buttock/leg. Acute onset states came from no where - moderate, dull to sharp/radiating. Patient denies saddle area numbness, leg numbness weakness ,normal bowel/bladder control or dysuria or hematuria. Denies trauma she works at [employer] but does not lift anything heavy. However she is standing or her feet 8hrs - 5day work week. . No fever or chills. Dx Acute left-sided low back pain with left-sided sciatica and Lumbar degenerative disc disease tx with pain medication and prednisone . ?Past Medical History:  ?Diagnosis Date  ? Eczema 2010  ? Hypertension   ? Morbid obesity ([place])   ?  ? ?Allergies  ?Allergen Reactions  ? Tomato   ? ? ?  ?Current Outpatient Medications on File Prior to Visit  ?Medication Sig Dispense Refill  ? celecoxib (CELEBREX) 200 MG capsule Take 1 capsule (200 mg total) by mouth 2 (two) times daily. 20 capsule 0  ? clobetasol ointment (TEMOVATE) 7.68 % Apply 1 application topically 2 (two) times daily as needed. 30 g 0  ? desonide (DESOWEN) 0.05 % ointment Apply 1 application topically 2 (two) times daily as needed. 15 g 0  ? diazepam (VALIUM) 2 MG tablet Take 1 tablet (2 mg total) by mouth every 6 (six) hours as needed for muscle spasms. 10 tablet 0  ? diphenhydrAMINE (BENADRYL) 25 MG tablet Take 50 mg by mouth daily.    ? folic acid (FOLVITE) 1 MG tablet Take 1 mg by mouth daily.    ? hydrOXYzine (ATARAX/VISTARIL) 10 MG tablet Take 10 mg by mouth at bedtime.    ? oxyCODONE (ROXICODONE) 5 MG immediate release tablet Take 0.5-1 tablets (2.5-5 mg total) by mouth every 6 (six) hours as needed for breakthrough pain or severe pain. 10 tablet 0  ? predniSONE (DELTASONE) 20 MG tablet 3 po once a day for 2 days, then 2 po once a day for 3 days, then 1 po once a day for 3 days 15 tablet  0  ? Skin Protectants, Misc. (EUCERIN) cream Apply topically as needed for dry skin. (Patient taking differently: Apply 1 application. topically daily as needed for dry skin.) 454 g 0  ? thiamine (VITAMIN B-1) 100 MG tablet Take 100 mg by mouth daily.    ? traMADol (ULTRAM) 50 MG tablet Take 1 tablet (50 mg total) by mouth every 6 (six) hours as needed. 15 tablet 0  ? metoprolol succinate (TOPROL-XL) 25 MG 24 hr tablet Take 1 tablet (25 mg total) by mouth daily. 90 tablet 3  ? ?No current facility-administered medications on file prior to visit.  ? ? ? ?Review of System: ?Comprehensive ROS Pertinent positive and negative noted in HPI   ?Objective:  ?BP 98/67 (BP Location: Right Arm, Patient Position: Sitting, Cuff Size: Normal)   Pulse 78   Temp 98 ?F (36.7 ?C) (Oral)   Ht '5\' 4"'$  (1.626 m)   Wt 171 lb 3.2 oz (77.7 kg)   LMP 11/06/2013   SpO2 100%   BMI 29.39 kg/m?  ? ?Filed Weights  ? 05/11/21 1416  ?Weight: 171 lb 3.2 oz (77.7 kg)  ? ? ?Physical Exam: ?General Appearance: Well nourished, in no apparent distress. ?Eyes: PERRLA, EOMs, conjunctiva no swelling or erythema ?Sinuses: No Frontal/maxillary tenderness ?ENT/Mouth: Ext aud canals clear, TMs without erythema, bulging.Hearing normal.  ?  Neck: Supple, thyroid normal.  ?Respiratory: Respiratory effort normal, BS equal bilaterally without rales, rhonchi, wheezing or stridor.  ?Cardio: RRR with no MRGs. Brisk peripheral pulses without edema.  ?Abdomen: Soft, + BS.  Non tender, no guarding, rebound, hernias, masses. ?Lymphatics: Non tender without lymphadenopathy.  ?Musculoskeletal:  low back pain , abnormal gait.  ?Skin:rashes, lesions, discoloration ?Neuro: Cranial nerves intact. Normal muscle tone, no cerebellar symptoms. Sensation intact.  ?Psych: Awake and oriented X 3, normal affect, Insight and Judgment appropriate.  ? ? ?Assessment:  ?N[place] was seen today for back pain. ? ?Diagnoses and all orders for this visit: ? ?Acute right-sided low back pain with  sciatica, sciatica laterality unspecified ?-     Ambulatory referral to Neurosurgery ? ?Hospital discharge follow-up ?Follow-Ups: Schedule an appointment with [name], NP (Internal Medicine) completed seen 4/1/and 4/5/@# ?  ? ?This note has been created with Surveyor, quantity. Any transcriptional errors are unintentional.  ? ?[name], NP ?05/11/2021, 2:34 PM ?  ? ?

## 2021-08-12 ENCOUNTER — Ambulatory Visit (INDEPENDENT_AMBULATORY_CARE_PROVIDER_SITE_OTHER): Payer: Commercial Managed Care - PPO | Admitting: Primary Care

## 2021-10-24 ENCOUNTER — Telehealth (INDEPENDENT_AMBULATORY_CARE_PROVIDER_SITE_OTHER): Payer: Self-pay | Admitting: *Deleted

## 2021-10-24 NOTE — Telephone Encounter (Signed)
Will forward to provider  

## 2021-10-24 NOTE — Telephone Encounter (Signed)
[place], [place] [facility] calling. States seeing pt for eczema, prior to starting on Dupixent labs drawn and pt's WBCs at 2.6. Questioning if you would want to follow up with labs as redo and manage pt or would you like them redrawn at dermatology practice. Please advise.  [place]: [phone]  Practice: [phone]

## 2022-04-22 ENCOUNTER — Ambulatory Visit (HOSPITAL_COMMUNITY)
Admission: EM | Admit: 2022-04-22 | Discharge: 2022-04-22 | Disposition: A | Discharge status: Home / Self Care | Payer: Commercial Managed Care - HMO | Attending: Emergency Medicine | Admitting: Emergency Medicine

## 2022-04-22 ENCOUNTER — Encounter (HOSPITAL_COMMUNITY): Payer: Self-pay

## 2022-04-22 DIAGNOSIS — H60391 Other infective otitis externa, right ear: Secondary | ICD-10-CM

## 2022-04-22 MED ORDER — AMOXICILLIN-POT CLAVULANATE 875-125 MG PO TABS: 1.0000 | ORAL_TABLET | Freq: Two times a day (BID) | ORAL | 0 refills | Status: DC

## 2022-04-22 NOTE — Discharge Instructions (Signed)
Please take medication as prescribed. Take with food to avoid upset stomach.  If at any point your symptoms worsen, you need to go directly to the hospital.  Please call your skin specialist for follow up as well.

## 2022-04-22 NOTE — ED Provider Notes (Signed)
Melanie Bradshaw    CSN: [id number] Arrival date & time: 04/22/22  1301      History   Chief Complaint Chief Complaint  Patient presents with   Rash    HPI Melanie Bradshaw is a 57 y.o. female.  Yesterday started having pain on her right ear Noticed swelling and some discharge She thinks something may have bit her. No fevers.  No medicines taken  Past Medical History:  Diagnosis Date   Eczema 2010   Hypertension    Morbid obesity [facility])     Patient Active Problem List   Diagnosis Date Noted   Elevated troponin    SVT (supraventricular tachycardia)    Acute dyspnea 03/28/2020   Tachycardia 03/28/2020   Hyperkalemia 03/28/2020   Alcohol use 03/28/2020   Essential hypertension 11/24/2013   UTI (urinary tract infection) 05/19/2013   Contact dermatitis 05/19/2013   HTN (hypertension) 05/19/2013    Past Surgical History:  Procedure Laterality Date   CESAREAN SECTION      OB History   No obstetric history on file.      Home Medications    Prior to Admission medications   Medication Sig Start Date End Date Taking? Authorizing Provider  amoxicillin-clavulanate (AUGMENTIN) 875-125 MG tablet Take 1 tablet by mouth 2 (two) times daily for 5 days. 04/22/22 04/27/22 Yes Kierah Goatley, Wells Guiles, PA-C  celecoxib (CELEBREX) 200 MG capsule Take 1 capsule (200 mg total) by mouth 2 (two) times daily. 05/11/21   [name], NP  clobetasol ointment (TEMOVATE) [id number] % Apply 1 application topically 2 (two) times daily as needed. 05/18/20   Cantwell, Celeste C, PA-C  desonide (DESOWEN) 0.05 % ointment Apply 1 application topically 2 (two) times daily as needed. 05/18/20   Cantwell, Celeste C, PA-C  diazepam (VALIUM) 2 MG tablet Take 1 tablet (2 mg total) by mouth every 6 (six) hours as needed for muscle spasms. 05/04/21   Margarita Mail, PA-C  diphenhydrAMINE (BENADRYL) 25 MG tablet Take 50 mg by mouth daily.    [provider]  folic acid (FOLVITE) 1 MG tablet Take 1 mg by  mouth daily.    [provider]  hydrOXYzine (ATARAX/VISTARIL) 10 MG tablet Take 10 mg by mouth at bedtime. 02/23/20   [provider]  metoprolol succinate (TOPROL-XL) 25 MG 24 hr tablet Take 1 tablet (25 mg total) by mouth daily. 04/05/20 03/31/21  Cantwell, Celeste C, PA-C  oxyCODONE (ROXICODONE) 5 MG immediate release tablet Take 0.5-1 tablets (2.5-5 mg total) by mouth every 6 (six) hours as needed for breakthrough pain or severe pain. 05/04/21   Margarita Mail, PA-C  predniSONE (DELTASONE) 20 MG tablet 3 po once a day for 2 days, then 2 po once a day for 3 days, then 1 po once a day for 3 days 05/01/21   Lajean Saver, MD  Skin Protectants, Misc. (EUCERIN) cream Apply topically as needed for dry skin. Patient taking differently: Apply 1 application. topically daily as needed for dry skin. 11/25/19   [name], NP  thiamine (VITAMIN B-1) 100 MG tablet Take 100 mg by mouth daily.    [provider]  traMADol (ULTRAM) 50 MG tablet Take 1 tablet (50 mg total) by mouth every 6 (six) hours as needed. 04/30/21   Lajean Saver, MD    Family History Family History  Problem Relation Age of Onset   Diabetes Mother    Hypertension Mother    Diabetes Sister    Diabetes Brother  Social History Social History   Tobacco Use   Smoking status: Never   Smokeless tobacco: Never  Vaping Use   Vaping Use: Never used  Substance Use Topics   Alcohol use: Yes    Alcohol/week: 3.0 standard drinks of alcohol    Types: 3 Cans of beer per week    Comment: SOCIAL   Drug use: Yes    Types: Marijuana     Allergies   Tomato   Review of Systems Review of Systems As per HPI  Physical Exam Triage Vital Signs ED Triage Vitals [04/22/22 1414]  Enc Vitals Group     BP 116/78     Pulse Rate 78     Resp 18     Temp 98.1 F (36.7 C)     Temp Source Oral     SpO2 98 %     Weight      Height      Head Circumference      Peak Flow      Pain Score      Pain Loc       Pain Edu?      Excl. in [place]?    No data found.  Updated Vital Signs BP 116/78 (BP Location: Left Arm)   Pulse 78   Temp 98.1 F (36.7 C) (Oral)   Resp 18   LMP 11/06/2013   SpO2 98%   Physical Exam Vitals and nursing note reviewed.  Constitutional:      General: She is not in acute distress. HENT:     Right Ear: Drainage, swelling and tenderness present. No mastoid tenderness.     Left Ear: Tympanic membrane, ear canal and external ear normal.     Ears:     Comments: Right pinna and earlobe are very swollen, erythematous, tender.  There is a little bit of purulent drainage near the tragus.  Swelling blocks the canal    Nose: No rhinorrhea.     Mouth/Throat:     Mouth: Mucous membranes are moist.     Pharynx: Oropharynx is clear.  Eyes:     Conjunctiva/sclera: Conjunctivae normal.  Cardiovascular:     Rate and Rhythm: Normal rate and regular rhythm.  Pulmonary:     Effort: Pulmonary effort is normal.  Neurological:     Mental Status: She is alert and oriented to person, place, and time.     UC Treatments / Results  Labs (all labs ordered are listed, but only abnormal results are displayed) Labs Reviewed - No data to display  EKG   Radiology No results found.  Procedures Procedures (including critical care time)  Medications Ordered in UC Medications - No data to display  Initial Impression / Assessment and Plan / UC Course  I have reviewed the triage vital signs and the nursing notes.  Pertinent labs & imaging results that were available during my care of the patient were reviewed by me and considered in my medical decision making (see chart for details).  External ear infection. Cannot visualize inner canal or TM on right. Augmentin twice daily for 5 days.  Recommend to return if symptoms persist despite this medicine.  Strict ED precautions for worsening.  Patient agrees to plan  Final Clinical Impressions(s) / UC Diagnoses   Final diagnoses:  Pinna  infection, acute, right     Discharge Instructions      Please take medication as prescribed. Take with food to avoid upset stomach.  If at any point your  symptoms worsen, you need to go directly to the hospital.  Please call your skin specialist for follow up as well.    ED Prescriptions     Medication Sig Dispense Auth. Provider   amoxicillin-clavulanate (AUGMENTIN) 875-125 MG tablet Take 1 tablet by mouth 2 (two) times daily for 5 days. 10 tablet Severina Sykora, Wells Guiles, PA-C      PDMP not reviewed this encounter.   [place], [place] 04/22/22 1513

## 2022-04-22 NOTE — ED Triage Notes (Signed)
Pt presents to the office for bumps all over her body. Pt stated she noticed the bumps yesterday.

## 2022-04-23 ENCOUNTER — Other Ambulatory Visit: Payer: Self-pay

## 2022-04-23 ENCOUNTER — Emergency Department (HOSPITAL_COMMUNITY): Payer: Commercial Managed Care - HMO

## 2022-04-23 ENCOUNTER — Observation Stay (HOSPITAL_COMMUNITY)
Admission: EM | Admit: 2022-04-23 | Discharge: 2022-04-24 | Disposition: A | Discharge status: Home / Self Care | Payer: Commercial Managed Care - HMO | Attending: Family Medicine | Admitting: Family Medicine

## 2022-04-23 ENCOUNTER — Encounter (HOSPITAL_COMMUNITY): Payer: Self-pay | Admitting: Internal Medicine

## 2022-04-23 DIAGNOSIS — E871 Hypo-osmolality and hyponatremia: Secondary | ICD-10-CM | POA: Diagnosis not present

## 2022-04-23 DIAGNOSIS — F1010 Alcohol abuse, uncomplicated: Secondary | ICD-10-CM | POA: Diagnosis not present

## 2022-04-23 DIAGNOSIS — I471 Supraventricular tachycardia, unspecified: Secondary | ICD-10-CM | POA: Diagnosis present

## 2022-04-23 DIAGNOSIS — Z789 Other specified health status: Secondary | ICD-10-CM | POA: Diagnosis present

## 2022-04-23 DIAGNOSIS — Z79899 Other long term (current) drug therapy: Secondary | ICD-10-CM | POA: Insufficient documentation

## 2022-04-23 DIAGNOSIS — H6001 Abscess of right external ear: Secondary | ICD-10-CM | POA: Diagnosis not present

## 2022-04-23 DIAGNOSIS — H9201 Otalgia, right ear: Secondary | ICD-10-CM | POA: Diagnosis present

## 2022-04-23 DIAGNOSIS — E440 Moderate protein-calorie malnutrition: Secondary | ICD-10-CM | POA: Diagnosis present

## 2022-04-23 DIAGNOSIS — I10 Essential (primary) hypertension: Secondary | ICD-10-CM | POA: Diagnosis present

## 2022-04-23 DIAGNOSIS — H61001 Unspecified perichondritis of right external ear: Principal | ICD-10-CM

## 2022-04-23 DIAGNOSIS — F1090 Alcohol use, unspecified, uncomplicated: Secondary | ICD-10-CM | POA: Diagnosis present

## 2022-04-23 DIAGNOSIS — D649 Anemia, unspecified: Secondary | ICD-10-CM | POA: Diagnosis not present

## 2022-04-23 DIAGNOSIS — K521 Toxic gastroenteritis and colitis: Secondary | ICD-10-CM | POA: Insufficient documentation

## 2022-04-23 LAB — CBC WITH DIFFERENTIAL/PLATELET
Abs Immature Granulocytes: 0.08 10*3/uL — ABNORMAL HIGH (ref 0.00–0.07)
Basophils Absolute: 0 10*3/uL (ref 0.0–0.1)
Basophils Relative: 0 %
Eosinophils Absolute: 0 10*3/uL (ref 0.0–0.5)
Eosinophils Relative: 0 %
HCT: 27.9 % — ABNORMAL LOW (ref 36.0–46.0)
Hemoglobin: 9.8 g/dL — ABNORMAL LOW (ref 12.0–15.0)
Immature Granulocytes: 1 %
Lymphocytes Relative: 9 %
Lymphs Abs: 1 10*3/uL (ref 0.7–4.0)
MCH: 34.6 pg — ABNORMAL HIGH (ref 26.0–34.0)
MCHC: 35.1 g/dL (ref 30.0–36.0)
MCV: 98.6 fL (ref 80.0–100.0)
Monocytes Absolute: 1 10*3/uL (ref 0.1–1.0)
Monocytes Relative: 9 %
Neutro Abs: 9.1 10*3/uL — ABNORMAL HIGH (ref 1.7–7.7)
Neutrophils Relative %: 81 %
Platelets: 322 10*3/uL (ref 150–400)
RBC: 2.83 MIL/uL — ABNORMAL LOW (ref 3.87–5.11)
RDW: 13.5 % (ref 11.5–15.5)
WBC: 11.2 10*3/uL — ABNORMAL HIGH (ref 4.0–10.5)
nRBC: 0 % (ref 0.0–0.2)

## 2022-04-23 LAB — HEPATIC FUNCTION PANEL
ALT: 12 U/L (ref 0–44)
AST: 16 U/L (ref 15–41)
Albumin: 2.6 g/dL — ABNORMAL LOW (ref 3.5–5.0)
Alkaline Phosphatase: 117 U/L (ref 38–126)
Bilirubin, Direct: 0.2 mg/dL (ref 0.0–0.2)
Indirect Bilirubin: 0.8 mg/dL (ref 0.3–0.9)
Total Bilirubin: 1 mg/dL (ref 0.3–1.2)
Total Protein: 7.1 g/dL (ref 6.5–8.1)

## 2022-04-23 LAB — RETICULOCYTES
Immature Retic Fract: 31.7 % — ABNORMAL HIGH (ref 2.3–15.9)
RBC.: 2.75 MIL/uL — ABNORMAL LOW (ref 3.87–5.11)
Retic Count, Absolute: 90.2 10*3/uL (ref 19.0–186.0)
Retic Ct Pct: 3.3 % — ABNORMAL HIGH (ref 0.4–3.1)

## 2022-04-23 LAB — FERRITIN: Ferritin: 152 ng/mL (ref 11–307)

## 2022-04-23 LAB — PHOSPHORUS: Phosphorus: 4.3 mg/dL (ref 2.5–4.6)

## 2022-04-23 LAB — IRON AND TIBC
Iron: 83 ug/dL (ref 28–170)
Saturation Ratios: 49 % — ABNORMAL HIGH (ref 10.4–31.8)
TIBC: 169 ug/dL — ABNORMAL LOW (ref 250–450)
UIBC: 86 ug/dL

## 2022-04-23 LAB — BASIC METABOLIC PANEL
Anion gap: 10 (ref 5–15)
BUN: 10 mg/dL (ref 6–20)
CO2: 20 mmol/L — ABNORMAL LOW (ref 22–32)
Calcium: 8.3 mg/dL — ABNORMAL LOW (ref 8.9–10.3)
Chloride: 94 mmol/L — ABNORMAL LOW (ref 98–111)
Creatinine, Ser: 0.86 mg/dL (ref 0.44–1.00)
GFR, Estimated: >60 mL/min (ref >60)
Glucose, Bld: 101 mg/dL — ABNORMAL HIGH (ref 70–99)
Potassium: 4.3 mmol/L (ref 3.5–5.1)
Sodium: 124 mmol/L — ABNORMAL LOW (ref 135–145)

## 2022-04-23 LAB — MAGNESIUM: Magnesium: 2.1 mg/dL (ref 1.7–2.4)

## 2022-04-23 LAB — OSMOLALITY, URINE: Osmolality, Ur: 354 mOsm/kg (ref 300–900)

## 2022-04-23 LAB — SODIUM, URINE, RANDOM: Sodium, Ur: 81 mmol/L

## 2022-04-23 LAB — VITAMIN B12: Vitamin B-12: 233 pg/mL (ref 180–914)

## 2022-04-23 LAB — FOLATE: Folate: 6.6 ng/mL (ref >5.9)

## 2022-04-23 MED ORDER — ACETAMINOPHEN 650 MG RE SUPP: 650.0000 mg | Freq: Four times a day (QID) | RECTAL | Status: DC | PRN

## 2022-04-23 MED ORDER — LORAZEPAM 1 MG PO TABS: 0.0000 mg | ORAL_TABLET | Freq: Two times a day (BID) | ORAL | Status: DC

## 2022-04-23 MED ORDER — CIPROFLOXACIN IN D5W 400 MG/200ML IV SOLN
400.0000 mg | Freq: Two times a day (BID) | INTRAVENOUS | Status: DC
  Administered 2022-04-23 – 2022-04-24 (×2): 400 mg via INTRAVENOUS
  Filled 2022-04-23 (×2): qty 200

## 2022-04-23 MED ORDER — VITAMIN B-12 1000 MCG PO TABS
1000.0000 ug | ORAL_TABLET | Freq: Every day | ORAL | Status: DC
  Administered 2022-04-23 – 2022-04-24 (×2): 1000 ug via ORAL
  Filled 2022-04-23 (×2): qty 1

## 2022-04-23 MED ORDER — CIPROFLOXACIN IN D5W 400 MG/200ML IV SOLN
400.0000 mg | Freq: Once | INTRAVENOUS | Status: AC
  Administered 2022-04-23: 400 mg via INTRAVENOUS
  Filled 2022-04-23: qty 200

## 2022-04-23 MED ORDER — LIDOCAINE HCL 2 % IJ SOLN
10.0000 mL | Freq: Once | INTRAMUSCULAR | Status: AC
  Administered 2022-04-23: 200 mg via INTRADERMAL
  Filled 2022-04-23: qty 20

## 2022-04-23 MED ORDER — LORAZEPAM 1 MG PO TABS: 1.0000 mg | ORAL_TABLET | ORAL | Status: DC | PRN

## 2022-04-23 MED ORDER — OXYCODONE HCL 5 MG PO TABS: 5.0000 mg | ORAL_TABLET | Freq: Four times a day (QID) | ORAL | Status: DC | PRN

## 2022-04-23 MED ORDER — ONDANSETRON HCL 4 MG/2ML IJ SOLN: 4.0000 mg | Freq: Four times a day (QID) | INTRAMUSCULAR | Status: DC | PRN

## 2022-04-23 MED ORDER — FENTANYL CITRATE PF 50 MCG/ML IJ SOSY
50.0000 ug | PREFILLED_SYRINGE | Freq: Once | INTRAMUSCULAR | Status: AC
  Administered 2022-04-23: 50 ug via INTRAVENOUS
  Filled 2022-04-23: qty 1

## 2022-04-23 MED ORDER — KETOROLAC TROMETHAMINE 30 MG/ML IJ SOLN
30.0000 mg | Freq: Four times a day (QID) | INTRAMUSCULAR | Status: DC | PRN
  Administered 2022-04-23: 30 mg via INTRAVENOUS
  Filled 2022-04-23: qty 1

## 2022-04-23 MED ORDER — CLOBETASOL PROPIONATE 0.05 % EX OINT
TOPICAL_OINTMENT | Freq: Two times a day (BID) | CUTANEOUS | Status: DC
  Filled 2022-04-23: qty 15

## 2022-04-23 MED ORDER — ONDANSETRON HCL 4 MG PO TABS: 4.0000 mg | ORAL_TABLET | Freq: Four times a day (QID) | ORAL | Status: DC | PRN

## 2022-04-23 MED ORDER — ACETAMINOPHEN 500 MG PO TABS
1000.0000 mg | ORAL_TABLET | Freq: Once | ORAL | Status: AC
  Administered 2022-04-23: 1000 mg via ORAL
  Filled 2022-04-23: qty 2

## 2022-04-23 MED ORDER — SODIUM CHLORIDE 0.9 % IV SOLN
1000.0000 mL | INTRAVENOUS | Status: DC
  Administered 2022-04-23 (×3): 1000 mL via INTRAVENOUS

## 2022-04-23 MED ORDER — ACETAMINOPHEN 325 MG PO TABS
650.0000 mg | ORAL_TABLET | Freq: Four times a day (QID) | ORAL | Status: DC | PRN
  Administered 2022-04-23: 650 mg via ORAL
  Filled 2022-04-23: qty 2

## 2022-04-23 MED ORDER — ADULT MULTIVITAMIN W/MINERALS CH
1.0000 | ORAL_TABLET | Freq: Every day | ORAL | Status: DC
  Administered 2022-04-23 – 2022-04-24 (×2): 1 via ORAL
  Filled 2022-04-23 (×2): qty 1

## 2022-04-23 MED ORDER — SODIUM CHLORIDE 0.9 % IV BOLUS (SEPSIS)
1000.0000 mL | Freq: Once | INTRAVENOUS | Status: AC
Start: 2022-04-23 — End: 2022-04-23
  Administered 2022-04-23: 1000 mL via INTRAVENOUS

## 2022-04-23 MED ORDER — IOHEXOL 300 MG/ML  SOLN
100.0000 mL | Freq: Once | INTRAMUSCULAR | Status: AC | PRN
  Administered 2022-04-23: 100 mL via INTRAVENOUS

## 2022-04-23 MED ORDER — THIAMINE HCL 100 MG/ML IJ SOLN: 100.0000 mg | Freq: Every day | INTRAMUSCULAR | Status: DC

## 2022-04-23 MED ORDER — FOLIC ACID 1 MG PO TABS
1.0000 mg | ORAL_TABLET | Freq: Every day | ORAL | Status: DC
  Administered 2022-04-23 – 2022-04-24 (×2): 1 mg via ORAL
  Filled 2022-04-23 (×2): qty 1

## 2022-04-23 MED ORDER — LORAZEPAM 1 MG PO TABS: 0.0000 mg | ORAL_TABLET | Freq: Four times a day (QID) | ORAL | Status: DC

## 2022-04-23 MED ORDER — THIAMINE MONONITRATE 100 MG PO TABS
100.0000 mg | ORAL_TABLET | Freq: Every day | ORAL | Status: DC
  Administered 2022-04-23 – 2022-04-24 (×2): 100 mg via ORAL
  Filled 2022-04-23 (×2): qty 1

## 2022-04-23 NOTE — H&P (Signed)
History and Physical    Patient: Melanie Bradshaw [id number] DOB: 1965/05/19 DOA: 04/23/2022 DOS: the patient was seen and examined on 04/23/2022 PCP: [name], NP  Patient coming from: Home  Chief Complaint:  Chief Complaint  Patient presents with   Otalgia   HPI: Melanie Bradshaw is a 57 y.o. female with medical history significant of eczema, hypertension, supraventricular tachycardia, history of UTI, hyperkalemia, elevated troponin, morbid obesity, currently overweight with a BMI of 26.85 kg/m, alcohol use who presented to the emergency department with right ear pain and edema.  She was standing up to urgent care yesterday due to generalized rash.  She was diagnosed with a right ear infection and was prescribed Augmentin.  She had an episode of diarrhea after taking the Augmentin.  She subsequently came to the emergency department last night.  She has been having a dry cough, but denied fever, chills, rhinorrhea, sore throat, cervical adenopathy, wheezing or hemoptysis.  No chest pain, palpitations, diaphoresis, PND, orthopnea or pitting edema of the lower extremities.  No abdominal pain, nausea, emesis, diarrhea, constipation, melena or hematochezia.  No flank pain, dysuria, frequency or hematuria.  No polyuria, polydipsia, polyphagia or blurred vision.   Lab work: CBC showed a white count of 11.2 with 81% neutrophils, hemoglobin 9.8 g/dL platelets 322.  BMP shows sodium 124, potassium 4.3, chloride 94 and CO2 20 mmol/L with a normal anion gap.  Glucose 101 and calcium 8.3 mg/dL.  Renal function tests were normal.  Imaging: CT temporal bones show an abnormally enlarged and heterogenous right P9 with an internal 4 mm fluid collection other [place], nonspecific but possibly separation/abscess.  Right temporal bone, middle ear and mastoids remain intact and normal.  Normal contralateral left temporal bone.  Moderate generalized paranasal sinus inflammation suggesting acute sinusitis in the  appropriate clinical setting.  Possible age advanced cerebral volume loss.  ED course: Initial vital signs were temperature 98.9 F, pulse 80 Konner respiration 18, BP 111/81 mmHg O2 sat 100% on room air.  The patient received acetaminophen 1000 mg p.o., ciprofloxacin 400 mg IVP, fentanyl 50 mcg IVP, Normal Saline 1000 mL bolus.  After speaking with ENT on-call (Dr. Redmond Baseman), Dr. Leonette Monarch proceeded to drain her right ear abscess after premedicating with a 2% lidocaine injection.  Dr. Redmond Baseman also suggested to start the patient on ciprofloxacin.   Review of Systems: As mentioned in the history of present illness. All other systems reviewed and are negative. Past Medical History:  Diagnosis Date   Eczema 2010   Hypertension    Morbid obesity ([employer])    Past Surgical History:  Procedure Laterality Date   CESAREAN SECTION     Social History:  reports that she has never smoked. She has never used smokeless tobacco. She reports current alcohol use of about 3.0 standard drinks of alcohol per week. She reports current drug use. Drug: Marijuana.  Allergies  Allergen Reactions   Tomato     Family History  Problem Relation Age of Onset   Diabetes Mother    Hypertension Mother    Diabetes Sister    Diabetes Brother     Prior to Admission medications   Medication Sig Start Date End Date Taking? Authorizing Provider  amoxicillin-clavulanate (AUGMENTIN) 875-125 MG tablet Take 1 tablet by mouth 2 (two) times daily for 5 days. 04/22/22 04/27/22  Rising, Wells Guiles, PA-C  celecoxib (CELEBREX) 200 MG capsule Take 1 capsule (200 mg total) by mouth 2 (two) times daily. 05/11/21   [name], NP  clobetasol ointment (TEMOVATE) AB-123456789 % Apply 1 application topically 2 (two) times daily as needed. 05/18/20   Cantwell, Celeste C, PA-C  desonide (DESOWEN) 0.05 % ointment Apply 1 application topically 2 (two) times daily as needed. 05/18/20   Cantwell, Celeste C, PA-C  diazepam (VALIUM) 2 MG tablet Take 1 tablet (2  mg total) by mouth every 6 (six) hours as needed for muscle spasms. 05/04/21   Margarita Mail, PA-C  diphenhydrAMINE (BENADRYL) 25 MG tablet Take 50 mg by mouth daily.    [provider]  folic acid (FOLVITE) 1 MG tablet Take 1 mg by mouth daily.    [provider]  hydrOXYzine (ATARAX/VISTARIL) 10 MG tablet Take 10 mg by mouth at bedtime. 02/23/20   [provider]  metoprolol succinate (TOPROL-XL) 25 MG 24 hr tablet Take 1 tablet (25 mg total) by mouth daily. 04/05/20 03/31/21  Cantwell, Celeste C, PA-C  oxyCODONE (ROXICODONE) 5 MG immediate release tablet Take 0.5-1 tablets (2.5-5 mg total) by mouth every 6 (six) hours as needed for breakthrough pain or severe pain. 05/04/21   Margarita Mail, PA-C  predniSONE (DELTASONE) 20 MG tablet 3 po once a day for 2 days, then 2 po once a day for 3 days, then 1 po once a day for 3 days 05/01/21   Lajean Saver, MD  Skin Protectants, Misc. (EUCERIN) cream Apply topically as needed for dry skin. Patient taking differently: Apply 1 application. topically daily as needed for dry skin. 11/25/19   [name], NP  thiamine (VITAMIN B-1) 100 MG tablet Take 100 mg by mouth daily.    [provider]  traMADol (ULTRAM) 50 MG tablet Take 1 tablet (50 mg total) by mouth every 6 (six) hours as needed. 04/30/21   Lajean Saver, MD    Physical Exam: Vitals:   04/23/22 0034 04/23/22 0441 04/23/22 0443 04/23/22 0825  BP: 111/81  (!) 109/97 (!) 113/91  Pulse: 80  66 (!) 59  Resp: 18  13 16   Temp: 98.9 F (37.2 C) 97.8 F (36.6 C) 97.7 F (36.5 C) 97.7 F (36.5 C)  TempSrc: Oral Oral Oral Oral  SpO2: 100%  100% 100%  Weight: 70.9 kg     Height: 5\' 4"  (1.626 m)      Physical Exam Constitutional:      General: She is awake. She is not in acute distress.    Appearance: She is overweight.  HENT:     Head: Normocephalic.     Right Ear: Drainage, swelling and tenderness present.     Nose: No rhinorrhea.     Mouth/Throat:      Mouth: Mucous membranes are moist.  Eyes:     General: No scleral icterus.    Pupils: Pupils are equal, round, and reactive to light.  Neck:     Vascular: No JVD.  Cardiovascular:     Rate and Rhythm: Normal rate and regular rhythm.     Heart sounds: S1 normal and S2 normal.  Pulmonary:     Effort: Pulmonary effort is normal.     Breath sounds: Normal breath sounds.  Abdominal:     General: Bowel sounds are normal.     Palpations: Abdomen is soft.  Musculoskeletal:     Cervical back: Neck supple.     Right lower leg: No edema.     Left lower leg: No edema.  Lymphadenopathy:     Head:     Right side of head: Preauricular and posterior auricular adenopathy  present. No submandibular or tonsillar adenopathy.  Skin:    General: Skin is warm and dry.  Neurological:     General: No focal deficit present.     Mental Status: She is alert and oriented to person, place, and time.  Psychiatric:        Mood and Affect: Mood normal.        Behavior: Behavior normal. Behavior is cooperative.     Data Reviewed:  Results are pending, will review when available.  Assessment and Plan: Principal Problem:   Abscess of right external ear Admit to telemetry/inpatient. Change dressing as needed. Analgesics as needed. Continue ciprofloxacin 400 mg IVPB every 12 hours. ENT evaluation asked in or outpatient.  Active Problems:   Hyponatremia In the setting of diarrhea/beer drinking. Check urine sodium and osmolality. Continue normal saline infusion. Follow-up sodium level.    Normocytic anemia In the setting of chronic alcohol use. Check anemia panel. Monitor hematocrit and hemoglobin. Transfuse as needed.    Alcohol use Stated she drinks 2-3 beers daily. No signs of alcohol withdrawal. Will supplement with thiamine, folate and MVI. Monitor for withdrawal symptoms. Start CIWA protocol with lorazepam as needed.    Diarrhea due to drug  Had 1 large episode of loose stools. No  further diarrhea since then. Continue to monitor clinically.    Essential hypertension Blood pressure is soft. Hold antihypertensive. Monitor blood pressure and heart rate.    SVT (supraventricular tachycardia) Pulse mildly bradycardic or in the low 60s. Will not order metoprolol at the moment. Continue cardio monitoring for 24 hours.    Advance Care Planning:   Code Status: Full Code   Consults:   Family Communication:   Severity of Illness: The appropriate patient status for this patient is INPATIENT. Inpatient status is judged to be reasonable and necessary in order to provide the required intensity of service to ensure the patient's safety. The patient's presenting symptoms, physical exam findings, and initial radiographic and laboratory data in the context of their chronic comorbidities is felt to place them at high risk for further clinical deterioration. Furthermore, it is not anticipated that the patient will be medically stable for discharge from the hospital within 2 midnights of admission.   * I certify that at the point of admission it is my clinical judgment that the patient will require inpatient hospital care spanning beyond 2 midnights from the point of admission due to high intensity of service, high risk for further deterioration and high frequency of surveillance required.*  Author: Reubin Milan, MD 04/23/2022 8:37 AM  For on call review [email or web address].   This document was prepared using Dragon voice recognition software and may contain some unintended transcription errors.

## 2022-04-23 NOTE — ED Triage Notes (Signed)
Arrives EMS from home with right ear swelling that began tonight.   Recently at [facility] yesterday ~2PM for allergic reaction. She is unsure if it is r/t new medications. Started on amoxicillin but unsure what she is taking it for.

## 2022-04-23 NOTE — Progress Notes (Signed)
Pt stated that she uses Clobetasone cream to her hands and feet, when, when they itch. She showed me her medication that she uses at home. Notified Clarene Essex, NP of this. Order received.

## 2022-04-23 NOTE — Progress Notes (Signed)
57 year old female with eczema presents with otalgia.  Diagnosed with right ear lobe infection and abscess.  It was I&D.  Dr. Redmond Baseman ENT contacted and aware.  Requests admitting MD call if patient admitted.  Decision to start Cipro to include Pseudomonas coverage.  Additionally patient's sodium 124 which is new.  Patient reports she drinks beer a few days a week.

## 2022-04-23 NOTE — ED Notes (Signed)
Pt is ambulatory to restroom w/out assistance. 

## 2022-04-23 NOTE — ED Provider Notes (Signed)
[facility] Provider Note  CSN: [id number] Arrival date & time: 04/23/22 0021  Chief Complaint(s) Otalgia  HPI Melanie Bradshaw is a 57 y.o. female with a past medical history listed below who presents to the emergency department with several days of gradually worsening right earlobe pain.  She was seen in urgent care yesterday and diagnosed with a pain and infection and started on Augmentin.  Pain has been worse prompting her visit tonight.  She denies any trauma.  No fevers or chills.  No hearing loss.  No headache.  No other physical complaints.   Otalgia   Past Medical History Past Medical History:  Diagnosis Date   Eczema 2010   Hypertension    Morbid obesity [facility])    Patient Active Problem List   Diagnosis Date Noted   Elevated troponin    SVT (supraventricular tachycardia)    Acute dyspnea 03/28/2020   Tachycardia 03/28/2020   Hyperkalemia 03/28/2020   Alcohol use 03/28/2020   Essential hypertension 11/24/2013   UTI (urinary tract infection) 05/19/2013   Contact dermatitis 05/19/2013   HTN (hypertension) 05/19/2013   Home Medication(s) Prior to Admission medications   Medication Sig Start Date End Date Taking? Authorizing Provider  amoxicillin-clavulanate (AUGMENTIN) 875-125 MG tablet Take 1 tablet by mouth 2 (two) times daily for 5 days. 04/22/22 04/27/22  Rising, Wells Guiles, PA-C  celecoxib (CELEBREX) 200 MG capsule Take 1 capsule (200 mg total) by mouth 2 (two) times daily. 05/11/21   [name], NP  clobetasol ointment (TEMOVATE) [id number] % Apply 1 application topically 2 (two) times daily as needed. 05/18/20   Cantwell, Celeste C, PA-C  desonide (DESOWEN) 0.05 % ointment Apply 1 application topically 2 (two) times daily as needed. 05/18/20   Cantwell, Celeste C, PA-C  diazepam (VALIUM) 2 MG tablet Take 1 tablet (2 mg total) by mouth every 6 (six) hours as needed for muscle spasms. 05/04/21   Margarita Mail, PA-C   diphenhydrAMINE (BENADRYL) 25 MG tablet Take 50 mg by mouth daily.    [provider]  folic acid (FOLVITE) 1 MG tablet Take 1 mg by mouth daily.    [provider]  hydrOXYzine (ATARAX/VISTARIL) 10 MG tablet Take 10 mg by mouth at bedtime. 02/23/20   [provider]  metoprolol succinate (TOPROL-XL) 25 MG 24 hr tablet Take 1 tablet (25 mg total) by mouth daily. 04/05/20 03/31/21  Cantwell, Celeste C, PA-C  oxyCODONE (ROXICODONE) 5 MG immediate release tablet Take 0.5-1 tablets (2.5-5 mg total) by mouth every 6 (six) hours as needed for breakthrough pain or severe pain. 05/04/21   Margarita Mail, PA-C  predniSONE (DELTASONE) 20 MG tablet 3 po once a day for 2 days, then 2 po once a day for 3 days, then 1 po once a day for 3 days 05/01/21   Lajean Saver, MD  Skin Protectants, Misc. (EUCERIN) cream Apply topically as needed for dry skin. Patient taking differently: Apply 1 application. topically daily as needed for dry skin. 11/25/19   [name], NP  thiamine (VITAMIN B-1) 100 MG tablet Take 100 mg by mouth daily.    [provider]  traMADol (ULTRAM) 50 MG tablet Take 1 tablet (50 mg total) by mouth every 6 (six) hours as needed. 04/30/21   Lajean Saver, MD  Allergies Tomato  Review of Systems Review of Systems  HENT:  Positive for ear pain.    As noted in HPI  Physical Exam Vital Signs  I have reviewed the triage vital signs BP (!) 109/97 (BP Location: Right Arm)   Pulse 66   Temp 97.7 F (36.5 C) (Oral)   Resp 13   Ht 5\' 4"  (1.626 m)   Wt 70.9 kg   LMP 11/06/2013   SpO2 100%   BMI 26.85 kg/m   Physical Exam Vitals reviewed.  Constitutional:      General: She is not in acute distress.    Appearance: She is well-developed. She is not diaphoretic.  HENT:     Head: Normocephalic and atraumatic.     Right Ear:  Tympanic membrane normal. Drainage, swelling and tenderness present. No middle ear effusion. There is mastoid tenderness.     Left Ear: External ear normal. No mastoid tenderness.     Ears:     Comments: See image    Nose: Nose normal.  Eyes:     General: No scleral icterus.    Conjunctiva/sclera: Conjunctivae normal.  Neck:     Trachea: Phonation normal.  Cardiovascular:     Rate and Rhythm: Normal rate and regular rhythm.  Pulmonary:     Effort: Pulmonary effort is normal. No respiratory distress.     Breath sounds: No stridor.  Abdominal:     General: There is no distension.  Musculoskeletal:        General: Normal range of motion.     Cervical back: Normal range of motion.  Neurological:     Mental Status: She is alert and oriented to person, place, and time.  Psychiatric:        Behavior: Behavior normal.     ED Results and Treatments Labs (all labs ordered are listed, but only abnormal results are displayed) Labs Reviewed  CBC WITH DIFFERENTIAL/PLATELET - Abnormal; Notable for the following components:      Result Value   WBC 11.2 (*)    RBC 2.83 (*)    Hemoglobin 9.8 (*)    HCT 27.9 (*)    MCH 34.6 (*)    Neutro Abs 9.1 (*)    Abs Immature Granulocytes 0.08 (*)    All other components within normal limits  BASIC METABOLIC PANEL - Abnormal; Notable for the following components:   Sodium 124 (*)    Chloride 94 (*)    CO2 20 (*)    Glucose, Bld 101 (*)    Calcium 8.3 (*)    All other components within normal limits                                                                                                                         EKG  EKG Interpretation  Date/Time:    Ventricular Rate:    PR Interval:    QRS Duration:   QT Interval:    QTC Calculation:   R  Axis:     Text Interpretation:         Radiology CT Temporal Bones W Contrast  Addendum Date: 04/23/2022   ADDENDUM REPORT: 04/23/2022 04:43 ADDENDUM: Study discussed by telephone with Dr.  Addison Lank on 04/23/2022 at 0427 hours. Electronically Signed   By: Genevie Ann M.D.   On: 04/23/2022 04:43   Result Date: 04/23/2022 CLINICAL DATA:  57 year old female with a tell Oralia Rud. EXAM: CT TEMPORAL BONES WITH CONTRAST TECHNIQUE: Axial and coronal plane CT imaging of the petrous temporal bones was performed with thin-collimation image reconstruction following intravenous contrast administration. Multiplanar CT image reconstructions were also generated. RADIATION DOSE REDUCTION: This exam was performed according to the departmental dose-optimization program which includes automated exposure control, adjustment of the mA and/or kV according to patient size and/or use of iterative reconstruction technique. CONTRAST:  164mL OMNIPAQUE IOHEXOL 300 MG/ML  SOLN COMPARISON:  None Available. FINDINGS: RIGHT TEMPORAL BONE The right pinna is abnormal, moderately to severely enlarged and demonstrates a slightly amorphous roughly 14 mm area of internal low-density (series 4, image 4) suggesting abnormal fluid collection, possibly suppuration. But there is relatively mild surrounding periauricular soft tissue stranding on the right. Soft tissue swelling does extend into the superficial aspect of the right external auditory canal. However, the right EAC remains patent (series 7, image 156). The right tympanic membrane appears gracile and normal. The right tympanic cavity is clear. The right scutum and ossicles appear intact and aligned. Right mastoid antrum is clear. Right mastoid air cells appear clear and intact. No temporal bone erosion is identified. Right TMJ appears intact and aligned. Right internal auditory canal, cochlea, vestibule and vestibular aqueduct are within normal limits. Right side semicircular canals and course of the right 7th nerve appear normal. LEFT TEMPORAL BONE Normally aerated left EAC. Left tympanic membrane appears normal. Left tympanic cavity is clear. Left scutum and ossicles appear intact and  aligned. Left mastoid antrum is clear. Left mastoid air cells are clear, intact. Left IAC, cochlea, vestibule and vestibular aqueduct are within normal limits. Left side semicircular canals and course of the left 7th nerve appear normal. Left pinna appears diminutive, normal. Vascular: Major vascular structures in the upper neck and at the skull base are enhancing, patent. This includes the bilateral transverse and sigmoid sinuses, bilateral IJ bulbs. Calcified ICA siphon atherosclerosis. Limited intracranial: Cerebral volume seems to be at the lower limits of normal or abnormally decreased for age. No intracranial mass effect, midline shift, ventriculomegaly. No abnormal brain enhancement is identified. Visible orbits/paranasal sinuses: Visualized orbit soft tissues are within normal limits. Scattered bilateral paranasal sinus mucosal thickening and bubbly opacity. All visible sinuses affected. Mild if any layering sinus fluid at this time. Soft tissues: Outside of the abnormal right pinna, other visible face soft tissues appear symmetric and negative. Negative visible parapharyngeal and superior retropharyngeal space. Visualized scalp soft tissues are within normal limits. IMPRESSION: 1. Abnormally enlarged and heterogeneous Right Pinna, with an internal 14 mm fluid collection of the cartilage, nonspecific but possibly Suppuration/Abscess. In a diabetic or immunocompromised patient this is highly suspicious PSEUDOMONAS related Necrotizing Otitis Externa. 2. Right temporal bone, middle ear and mastoids remain intact and normal. 3. Normal contralateral left temporal bone. 4. Moderate generalized paranasal sinus inflammation suggesting acute sinusitis in the appropriate clinical setting. 5. Possible age advanced cerebral volume loss. Electronically Signed: By: Genevie Ann M.D. On: 04/23/2022 04:26    Medications Ordered in ED Medications  sodium chloride 0.9 % bolus 1,000 mL (1,000  mLs Intravenous New Bag/Given  04/23/22 0430)    Followed by  0.9 %  sodium chloride infusion (has no administration in time range)  ciprofloxacin (CIPRO) IVPB 400 mg (400 mg Intravenous New Bag/Given 04/23/22 0447)  acetaminophen (TYLENOL) tablet 1,000 mg (1,000 mg Oral Given 04/23/22 0130)  fentaNYL (SUBLIMAZE) injection 50 mcg (50 mcg Intravenous Given 04/23/22 0130)  lidocaine (XYLOCAINE) 2 % (with pres) injection 200 mg (200 mg Intradermal Given 04/23/22 0130)  iohexol (OMNIPAQUE) 300 MG/ML solution 100 mL (100 mLs Intravenous Contrast Given 04/23/22 0408)                                                                                                                                     Procedures .Marland KitchenIncision and Drainage  Date/Time: 04/23/2022 5:37 AM  Performed by: Fatima Blank, MD Authorized by: Fatima Blank, MD   Consent:    Consent obtained:  Verbal   Consent given by:  Patient   Risks discussed:  Bleeding, incomplete drainage, pain, infection and damage to other organs   Alternatives discussed:  Delayed treatment and alternative treatment Universal protocol:    Imaging studies available: yes     Patient identity confirmed:  Arm band and verbally with patient Location:    Type:  Abscess   Location:  Head   Head location:  R external ear Pre-procedure details:    Skin preparation:  Chlorhexidine Anesthesia:    Anesthesia method:  Local infiltration   Local anesthetic:  Lidocaine 2% w/o epi Procedure type:    Complexity:  Complex   (including critical care time)  Medical Decision Making / ED Course  Click here for ABCD2, HEART and other calculators  Medical Decision Making Amount and/or Complexity of Data Reviewed Labs: ordered. Decision-making details documented in ED Course. Radiology: ordered and independent interpretation performed. Decision-making details documented in ED Course.  Risk OTC drugs. Prescription drug management. Decision regarding hospitalization. Minor  surgery with identified risk factors.    Patient presents with right otalgia Exam is consistent with perichondritis.  She does have draining purulence from the crus of helix.  Fluctuance to lobule and antitragus region.  No evidence of otitis externa or media noted on exam.  Labs and imaging ordered.  Spoke with Dr. Redmond Baseman from ENT who agreed this required I&D and change of Abx to Cipro. Possible out patient management if rest of work up is reassuring.  CBC with leukocytosis.  Stable hemoglobin. Metabolic clinical notable for hyponatremia.  No other significant electrolyte derangements or renal sufficiency.  Hyponatremia etiology undetermined.  Patient does admit to frequent, but not daily alcohol use (1-2 beers when she does drink). She does report feeling general weakness recently. Given range, may need admission to identify cause and manage.  Currently pending CT temporal to rule out mastoiditis.  CT w/o evidence of mastoiditis.   Abscesses were drained as above. IV antibiotics started.  Will discuss case  with hospitalist regarding admission for hyponatremia and to have an ENT follow-up and continue management of the perichondritis.       Final Clinical Impression(s) / ED Diagnoses Final diagnoses:  Perichondritis of right ear  Abscess of external ear, right  Hyponatremia           This chart was dictated using voice recognition software.  Despite best efforts to proofread,  errors can occur which can change the documentation meaning.    Fatima Blank, MD 04/23/22 [phone]

## 2022-04-24 DIAGNOSIS — H6001 Abscess of right external ear: Secondary | ICD-10-CM | POA: Diagnosis not present

## 2022-04-24 LAB — COMPREHENSIVE METABOLIC PANEL
ALT: 11 U/L (ref 0–44)
AST: 18 U/L (ref 15–41)
Albumin: 2.5 g/dL — ABNORMAL LOW (ref 3.5–5.0)
Alkaline Phosphatase: 94 U/L (ref 38–126)
Anion gap: 9 (ref 5–15)
BUN: 7 mg/dL (ref 6–20)
CO2: 20 mmol/L — ABNORMAL LOW (ref 22–32)
Calcium: 7.9 mg/dL — ABNORMAL LOW (ref 8.9–10.3)
Chloride: 99 mmol/L (ref 98–111)
Creatinine, Ser: 0.97 mg/dL (ref 0.44–1.00)
GFR, Estimated: >60 mL/min (ref >60)
Glucose, Bld: 101 mg/dL — ABNORMAL HIGH (ref 70–99)
Potassium: 3.5 mmol/L (ref 3.5–5.1)
Sodium: 128 mmol/L — ABNORMAL LOW (ref 135–145)
Total Bilirubin: 0.8 mg/dL (ref 0.3–1.2)
Total Protein: 6.4 g/dL — ABNORMAL LOW (ref 6.5–8.1)

## 2022-04-24 LAB — HIV ANTIBODY (ROUTINE TESTING W REFLEX): HIV Screen 4th Generation wRfx: NONREACTIVE

## 2022-04-24 LAB — CBC
HCT: 26.6 % — ABNORMAL LOW (ref 36.0–46.0)
Hemoglobin: 9 g/dL — ABNORMAL LOW (ref 12.0–15.0)
MCH: 34.7 pg — ABNORMAL HIGH (ref 26.0–34.0)
MCHC: 33.8 g/dL (ref 30.0–36.0)
MCV: 102.7 fL — ABNORMAL HIGH (ref 80.0–100.0)
Platelets: 281 10*3/uL (ref 150–400)
RBC: 2.59 MIL/uL — ABNORMAL LOW (ref 3.87–5.11)
RDW: 13.9 % (ref 11.5–15.5)
WBC: 4.2 10*3/uL (ref 4.0–10.5)
nRBC: 0 % (ref 0.0–0.2)

## 2022-04-24 MED ORDER — CIPROFLOXACIN HCL 500 MG PO TABS: 500.0000 mg | ORAL_TABLET | Freq: Two times a day (BID) | ORAL | 0 refills | Status: AC

## 2022-04-24 MED ORDER — OXYCODONE HCL 5 MG PO TABS: 5.0000 mg | ORAL_TABLET | Freq: Three times a day (TID) | ORAL | 0 refills | Status: DC | PRN

## 2022-04-24 NOTE — Progress Notes (Signed)
Discharge instructions discussed with patient and family verbalized agreement and understanding 

## 2022-04-24 NOTE — Hospital Course (Signed)
Melanie Bradshaw is a 57 y.o. F with HTN and alcohol use who presented with ear pain and swelling.  Underwent I&D in the ER.

## 2022-04-24 NOTE — Discharge Summary (Signed)
Physician Discharge Summary   Patient: Melanie Bradshaw MRN: [id number] DOB: [birth year]  Admit date:     04/23/2022  Discharge date: 04/24/22  Discharge Physician: Edwin Dada   PCP: [name], NP     Recommendations at discharge:  Follow up with PCP Dr. Oletta Lamas for recheck Na in 1 week Follow up with ENT Dr. Redmond Baseman for suppurative abscess of the pinna     Discharge Diagnoses: Principal Problem:   Abscess of right external ear Active Problems:   Essential hypertension   Alcohol use   Hyponatremia   Normocytic anemia      Hospital Course: Mrs.Markarian is a 57 y.o. F with HTN and alcohol use who presented with ear pain and swelling.  Underwent I&D in the ER. Noted incidentally to have hyponatremia    Abscess of right ear Had I&D in the ER, unfortunately no culture sent.  Started on IV antibiotics in the hospital, and ear pain improved, swelling improved (see below).  Discharged with PO ciprofloxacin and ENT follow up instructions.     Hyponatremia Mild, asymptomatic, likely due to alcohol use. Given IVF in the hospital and Na up to 128.  No contributing medications.  Discharged to follow up with PCP for repeat Na in 1 week            The [place] was reviewed for this patient prior to discharge.   Consultants: ENT, Dr. Redmond Baseman by phone Procedures performed: I&D of right ear  Disposition: Home   DISCHARGE MEDICATION: Allergies as of 04/24/2022       Reactions   Tomato         Medication List     STOP taking these medications    amoxicillin-clavulanate 875-125 MG tablet Commonly known as: AUGMENTIN       TAKE these medications    ciprofloxacin 500 MG tablet Commonly known as: Cipro Take 1 tablet (500 mg total) by mouth 2 (two) times daily for 6 days.   hydrOXYzine 10 MG tablet Commonly known as: ATARAX Take 10 mg by mouth at bedtime.   metoprolol succinate 25 MG 24 hr  tablet Commonly known as: TOPROL-XL Take 1 tablet (25 mg total) by mouth daily.   oxyCODONE 5 MG immediate release tablet Commonly known as: Oxy IR/ROXICODONE Take 1 tablet (5 mg total) by mouth every 8 (eight) hours as needed for breakthrough pain or moderate pain.        Follow-up Information     Melida Quitter, MD. Schedule an appointment as soon as possible for a visit in 1 week(s).   Specialty: Otolaryngology Contact information: [address] [place] [address] [phone]         [name], NP. Schedule an appointment as soon as possible for a visit in 1 week(s).   Specialty: Internal Medicine Contact information: [address] [place] [address] [phone]                 Discharge Instructions     Discharge instructions   Complete by: As directed    **IMPORTANT DISCHARGE INSTRUCTIONS**   From Dr. Loleta Books: You were admitted for an ear infection This was drained by the ER physician.  You were treated with antibiotics in the hospital and it appeared to be improving.  Take the antibiotic ciprofloxacin 500 mg (1 tab) twice daily for 1 week  Call Dr. Redmond Baseman, the otolaryngologist (Ear nose and throat surgeon) and let his office know you  were in the hospital for an ear infeection, they spoke with Dr. Redmond Baseman ,and you are supposed to get hospital follow up in his office this week or next   ALSO Resume your home medicines Go see Dr. Oletta Lamas in 1 week and ask her to check your sodium level    For pain, take acetaminophen/Tylenol 1000 mg (two tabs) up to three times daily  You may also take ibuprofen 400 mg (two tabs) up to three times daily  If you still have pain, take oxycodone 5 mg as needed  Do not drive or drink alcohol while taking oxycodone Dispose of excess   Increase activity slowly   Complete by: As directed        Discharge Exam: Filed Weights   04/23/22 0034  Weight: 70.9 kg    General:  Pt is alert, awake, not in acute distress ENT:  Cardiovascular: RRR, nl S1-S2, no murmurs appreciated.   No LE edema.   Respiratory: Normal respiratory rate and rhythm.  CTAB without rales or wheezes. Abdominal: Abdomen soft and non-tender.  No distension or HSM.   Neuro/Psych: Strength symmetric in upper and lower extremities.  Judgment and insight appear normal .   Condition at discharge: good  The results of significant diagnostics from this hospitalization (including imaging, microbiology, ancillary and laboratory) are listed below for reference.   Imaging Studies: CT Temporal Bones W Contrast  Addendum Date: 04/23/2022   ADDENDUM REPORT: 04/23/2022 04:43 ADDENDUM: Study discussed by telephone with Dr. Addison Lank on 04/23/2022 at 0427 hours. Electronically Signed   By: Genevie Ann M.D.   On: 04/23/2022 04:43   Result Date: 04/23/2022 CLINICAL DATA:  58 year old female with a tell Oralia Rud. EXAM: CT TEMPORAL BONES WITH CONTRAST TECHNIQUE: Axial and coronal plane CT imaging of the petrous temporal bones was performed with thin-collimation image reconstruction following intravenous contrast administration. Multiplanar CT image reconstructions were also generated. RADIATION DOSE REDUCTION: This exam was performed according to the departmental dose-optimization program which includes automated exposure control, adjustment of the mA and/or kV according to patient size and/or use of iterative reconstruction technique. CONTRAST:  11mL OMNIPAQUE IOHEXOL 300 MG/ML  SOLN COMPARISON:  None Available. FINDINGS: RIGHT TEMPORAL BONE The right pinna is abnormal, moderately to severely enlarged and demonstrates a slightly amorphous roughly 14 mm area of internal low-density (series 4, image 4) suggesting abnormal fluid collection, possibly suppuration. But there is relatively mild surrounding periauricular soft tissue stranding on the right. Soft tissue swelling does extend into the superficial aspect of the right  external auditory canal. However, the right EAC remains patent (series 7, image 156). The right tympanic membrane appears gracile and normal. The right tympanic cavity is clear. The right scutum and ossicles appear intact and aligned. Right mastoid antrum is clear. Right mastoid air cells appear clear and intact. No temporal bone erosion is identified. Right TMJ appears intact and aligned. Right internal auditory canal, cochlea, vestibule and vestibular aqueduct are within normal limits. Right side semicircular canals and course of the right 7th nerve appear normal. LEFT TEMPORAL BONE Normally aerated left EAC. Left tympanic membrane appears normal. Left tympanic cavity is clear. Left scutum and ossicles appear intact and aligned. Left mastoid antrum is clear. Left mastoid air cells are clear, intact. Left IAC, cochlea, vestibule and vestibular aqueduct are within normal limits. Left side semicircular canals and course of the left 7th nerve appear normal. Left pinna appears diminutive, normal. Vascular: Major vascular structures in the upper neck and at the skull  base are enhancing, patent. This includes the bilateral transverse and sigmoid sinuses, bilateral IJ bulbs. Calcified ICA siphon atherosclerosis. Limited intracranial: Cerebral volume seems to be at the lower limits of normal or abnormally decreased for age. No intracranial mass effect, midline shift, ventriculomegaly. No abnormal brain enhancement is identified. Visible orbits/paranasal sinuses: Visualized orbit soft tissues are within normal limits. Scattered bilateral paranasal sinus mucosal thickening and bubbly opacity. All visible sinuses affected. Mild if any layering sinus fluid at this time. Soft tissues: Outside of the abnormal right pinna, other visible face soft tissues appear symmetric and negative. Negative visible parapharyngeal and superior retropharyngeal space. Visualized scalp soft tissues are within normal limits. IMPRESSION: 1.  Abnormally enlarged and heterogeneous Right Pinna, with an internal 14 mm fluid collection of the cartilage, nonspecific but possibly Suppuration/Abscess. In a diabetic or immunocompromised patient this is highly suspicious PSEUDOMONAS related Necrotizing Otitis Externa. 2. Right temporal bone, middle ear and mastoids remain intact and normal. 3. Normal contralateral left temporal bone. 4. Moderate generalized paranasal sinus inflammation suggesting acute sinusitis in the appropriate clinical setting. 5. Possible age advanced cerebral volume loss. Electronically Signed: By: Genevie Ann M.D. On: 04/23/2022 04:26    Microbiology: Results for orders placed or performed during the hospital encounter of 03/28/20  SARS CORONAVIRUS 2 (TAT 6-24 HRS) Nasopharyngeal Nasopharyngeal Swab     Status: None   Collection Time: 03/28/20  6:21 PM   Specimen: Nasopharyngeal Swab  Result Value Ref Range Status   SARS Coronavirus 2 NEGATIVE NEGATIVE Final    Comment: (NOTE) SARS-CoV-2 target nucleic acids are NOT DETECTED.  The SARS-CoV-2 RNA is generally detectable in upper and lower respiratory specimens during the acute phase of infection. Negative results do not preclude SARS-CoV-2 infection, do not rule out co-infections with other pathogens, and should not be used as the sole basis for treatment or other patient management decisions. Negative results must be combined with clinical observations, patient history, and epidemiological information. The expected result is Negative.  Fact Sheet for Patients: SugarRoll.be  Fact Sheet for Healthcare Providers: https://www.woods-mathews.com/  This test is not yet approved or cleared by the [place] FDA and  has been authorized for detection and/or diagnosis of SARS-CoV-2 by FDA under an Emergency Use Authorization (EUA). This EUA will remain  in effect (meaning this test can be used) for the duration of the COVID-19  declaration under Se ction 564(b)(1) of the Act, 21 U.S.C. section 360bbb-3(b)(1), unless the authorization is terminated or revoked sooner.  Performed at [facility], [place] [address] [place], [place] [address]     Labs: CBC: Recent Labs  Lab 04/23/22 0119 04/24/22 0509  WBC 11.2* 4.2  NEUTROABS 9.1*  --   HGB 9.8* 9.0*  HCT 27.9* 26.6*  MCV 98.6 102.7*  PLT 322 [id number]   Basic Metabolic Panel: Recent Labs  Lab 04/23/22 0119 04/23/22 0851 04/24/22 0509  NA 124*  --  128*  K 4.3  --  3.5  CL 94*  --  99  CO2 20*  --  20*  GLUCOSE 101*  --  101*  BUN 10  --  7  CREATININE 0.86  --  0.97  CALCIUM 8.3*  --  7.9*  MG  --  2.1  --   PHOS  --  4.3  --    Liver Function Tests: Recent Labs  Lab 04/23/22 0851 04/24/22 0509  AST 16 18  ALT 12 11  ALKPHOS 117 94  BILITOT 1.0 0.8  PROT 7.1 6.4*  ALBUMIN 2.6* 2.5*   CBG: No results for input(s): "GLUCAP" in the last 168 hours.  Discharge time spent: approximately 35 minutes spent on discharge counseling, evaluation of patient on day of discharge, and coordination of discharge planning with nursing, social work, pharmacy and case management  Signed: Edwin Dada, MD [facility] 04/24/2022

## 2022-04-25 ENCOUNTER — Telehealth (INDEPENDENT_AMBULATORY_CARE_PROVIDER_SITE_OTHER): Payer: Self-pay

## 2022-04-25 NOTE — Transitions of Care (Post Inpatient/ED Visit) (Signed)
   04/25/2022  Name: Golda Mando MRN: [id number] DOB: 20-Sep-1965  Today's TOC FU Call Status: Today's TOC FU Call Status:: Successful TOC FU Call Competed TOC FU Call Complete Date: 04/25/22  Transition Care Management Follow-up Telephone Call Date of Discharge: 04/24/22 Discharge Facility: [facility]) Type of Discharge: Inpatient Admission Primary Inpatient Discharge Diagnosis:: Abscess of right external ear How have you been since you were released from the hospital?: Better Any questions or concerns?: No  Items Reviewed: Did you receive and understand the discharge instructions provided?: Yes Medications obtained and verified?: Yes (Medications Reviewed) Any new allergies since your discharge?: No Dietary orders reviewed?: Yes Do you have support at home?: Yes  Home Care and Equipment/Supplies: [place] Ordered?: No Any new equipment or medical supplies ordered?: No  Functional Questionnaire: Do you need assistance with bathing/showering or dressing?: No Do you need assistance with meal preparation?: No Do you need assistance with eating?: No Do you have difficulty maintaining continence: No Do you need assistance with getting out of bed/getting out of a chair/moving?: No Do you have difficulty managing or taking your medications?: No  Follow up appointments reviewed: PCP Follow-up appointment confirmed?: No (pt needs hosp fu by 05-08-22 and no available appts- please call pt to schedule fu appt) MD Provider Line [phone] Given: Yes Follow-up Provider: Juluis Mire NP [facility] Follow-up appointment confirmed?: No Reason Specialist Follow-Up Not Confirmed: Patient has Specialist Provider Number and will Call for Appointment Do you need transportation to your follow-up appointment?: No Do you understand care options if your condition(s) worsen?: Yes-patient verbalized understanding    [place] LPN [place] Direct Dial [phone]

## 2022-05-08 ENCOUNTER — Ambulatory Visit (INDEPENDENT_AMBULATORY_CARE_PROVIDER_SITE_OTHER): Payer: Commercial Managed Care - HMO | Admitting: Primary Care

## 2022-05-08 ENCOUNTER — Encounter (INDEPENDENT_AMBULATORY_CARE_PROVIDER_SITE_OTHER): Payer: Self-pay | Admitting: Primary Care

## 2022-05-08 VITALS — BP 99/70 | HR 78 | Ht 64.0 in | Wt 166.0 lb

## 2022-05-08 DIAGNOSIS — Z09 Encounter for follow-up examination after completed treatment for conditions other than malignant neoplasm: Secondary | ICD-10-CM | POA: Diagnosis not present

## 2022-05-08 NOTE — Progress Notes (Signed)
  [facility]   Subjective:   Melanie Bradshaw is a 57 y.o. female presents for hospital follow up. Admit date to the hospital was 04/23/22, patient was discharged from the hospital on 04/24/22, patient was admitted for: Abscess of right external ear. Patient stated went to UC took her money and nothing done. Ear continued to bother her and she presented the ED.  Past Medical History:  Diagnosis Date   Eczema 2010   Hypertension    Morbid obesity      Allergies  Allergen Reactions   Tomato       Current Outpatient Medications on File Prior to Visit  Medication Sig Dispense Refill   hydrOXYzine (ATARAX/VISTARIL) 10 MG tablet Take 10 mg by mouth at bedtime.     oxyCODONE (OXY IR/ROXICODONE) 5 MG immediate release tablet Take 1 tablet (5 mg total) by mouth every 8 (eight) hours as needed for breakthrough pain or moderate pain. 12 tablet 0   metoprolol succinate (TOPROL-XL) 25 MG 24 hr tablet Take 1 tablet (25 mg total) by mouth daily. (Patient not taking: Reported on 04/25/2022) 90 tablet 3   No current facility-administered medications on file prior to visit.     Review of System: Comprehensive ROS Pertinent positive and negative noted in HPI    Objective:  Blood Pressure 99/70   Pulse 78   Height 5\' 4"  (1.626 m)   Weight 166 lb (75.3 kg)   Last Menstrual Period 11/06/2013   Oxygen Saturation 100%   Body Mass Index 28.49 kg/m   Filed Weights   05/08/22 1426  Weight: 166 lb (75.3 kg)    Physical Exam: General Appearance: Well nourished, in no apparent distress. Eyes: PERRLA, EOMs, conjunctiva no swelling or erythema Sinuses: No Frontal/maxillary tenderness ENT/Mouth: Ext aud canals clear, TMs without erythema, bulging. No erythema, swelling, or exudate on post pharynx.  Tonsils not swollen or erythematous. Hearing normal.  Neck: Supple, thyroid normal.  Respiratory: Respiratory effort normal, BS equal bilaterally without rales, rhonchi, wheezing or stridor.   Cardio: RRR with no MRGs. Brisk peripheral pulses without edema.  Abdomen: Soft, + BS.  Non tender, no guarding, rebound, hernias, masses. Lymphatics: Non tender without lymphadenopathy.  Musculoskeletal: Full ROM, 5/5 strength, normal gait.  Skin: Warm, dry without rashes, lesions, ecchymosis.  Neuro: Cranial nerves intact. Normal muscle tone, no cerebellar symptoms. Sensation intact.  Psych: Awake and oriented X 3, normal affect, Insight and Judgment appropriate.    Assessment:  Maressa was seen today for hospitalization follow-up.  Diagnoses and all orders for this visit:  Hospital discharge follow-up Schedule an appointment with Christia Reading, MD (Otolaryngology) in 1 week (05/01/2022) Schedule an appointment with Grayce Sessions, NP (Internal Medicine) in 1 week (05/01/2022)   This note has been created with Dragon speech recognition software and Paediatric nurse. Any transcriptional errors are unintentional.   Grayce Sessions, NP 05/08/2022, 4:28 PM

## 2022-05-17 IMAGING — CR DG LUMBAR SPINE COMPLETE 4+V
5 series · 5 of 5 positions shown · non-contrast
Comparison: None.

CLINICAL DATA: Left-sided back pain.

EXAM:
LUMBAR SPINE - COMPLETE 4+ VIEW

[t lumbar spine ap]
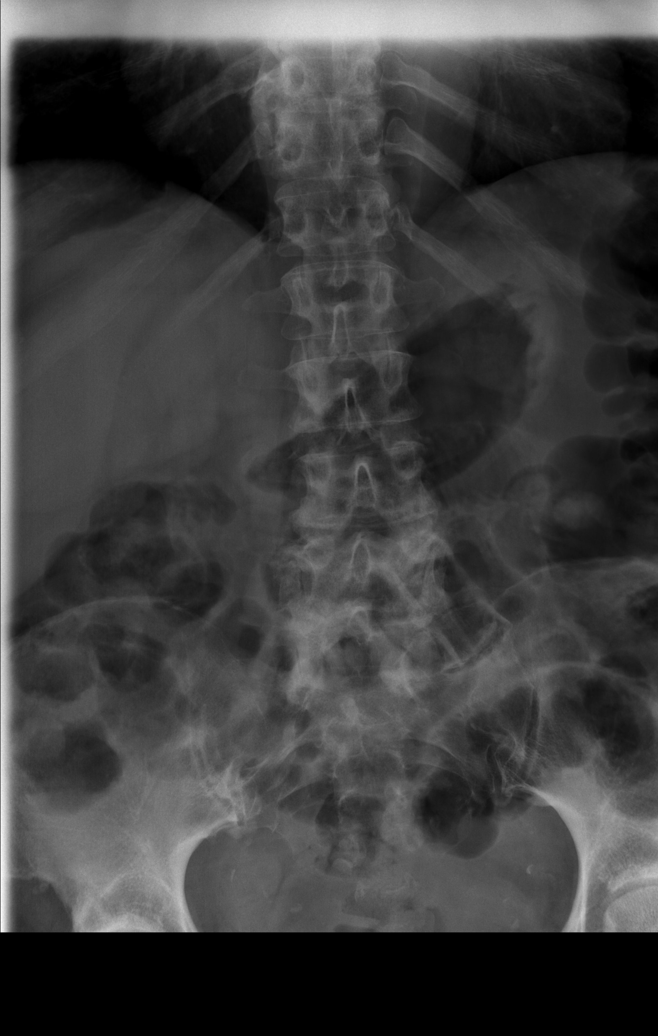

[t lumbar spine obl (1 of 2)]
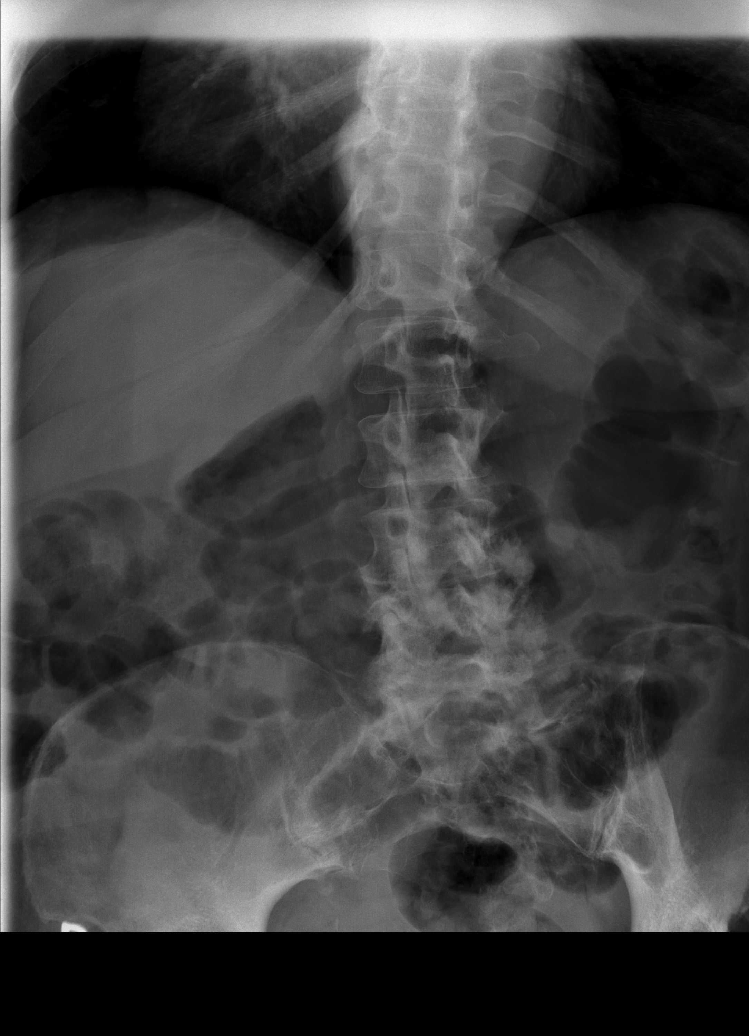

[t lumbar spine obl (2 of 2)]
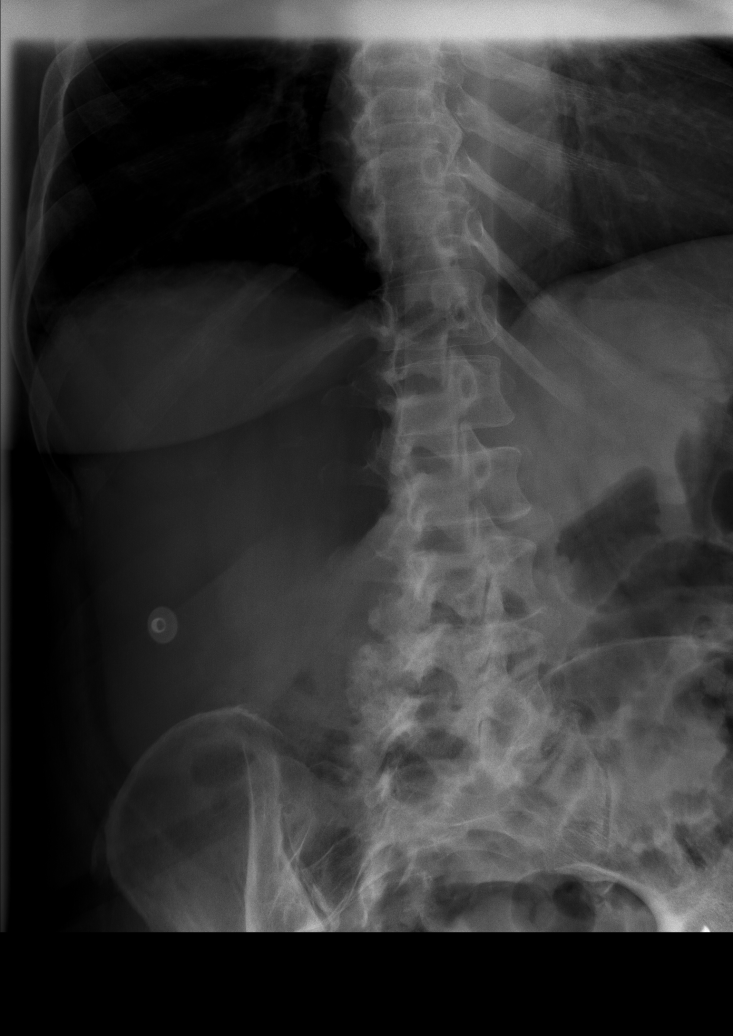

[t lumbar spine lat]
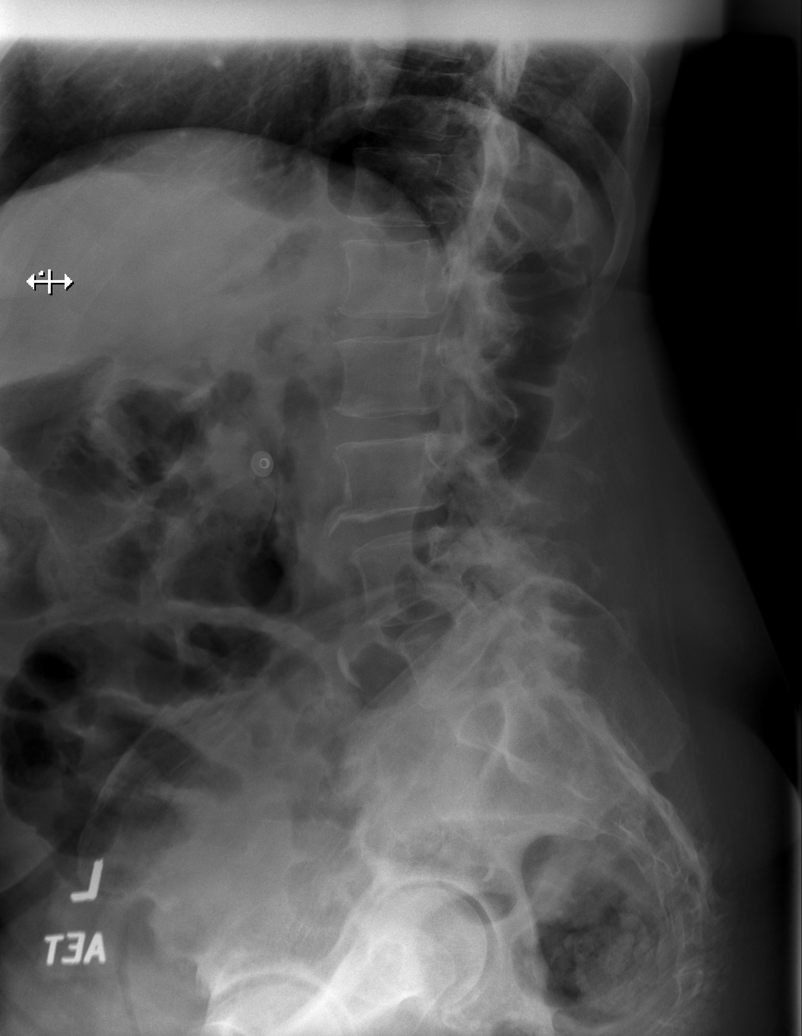

[t lumbar l-5 s-1 spot]
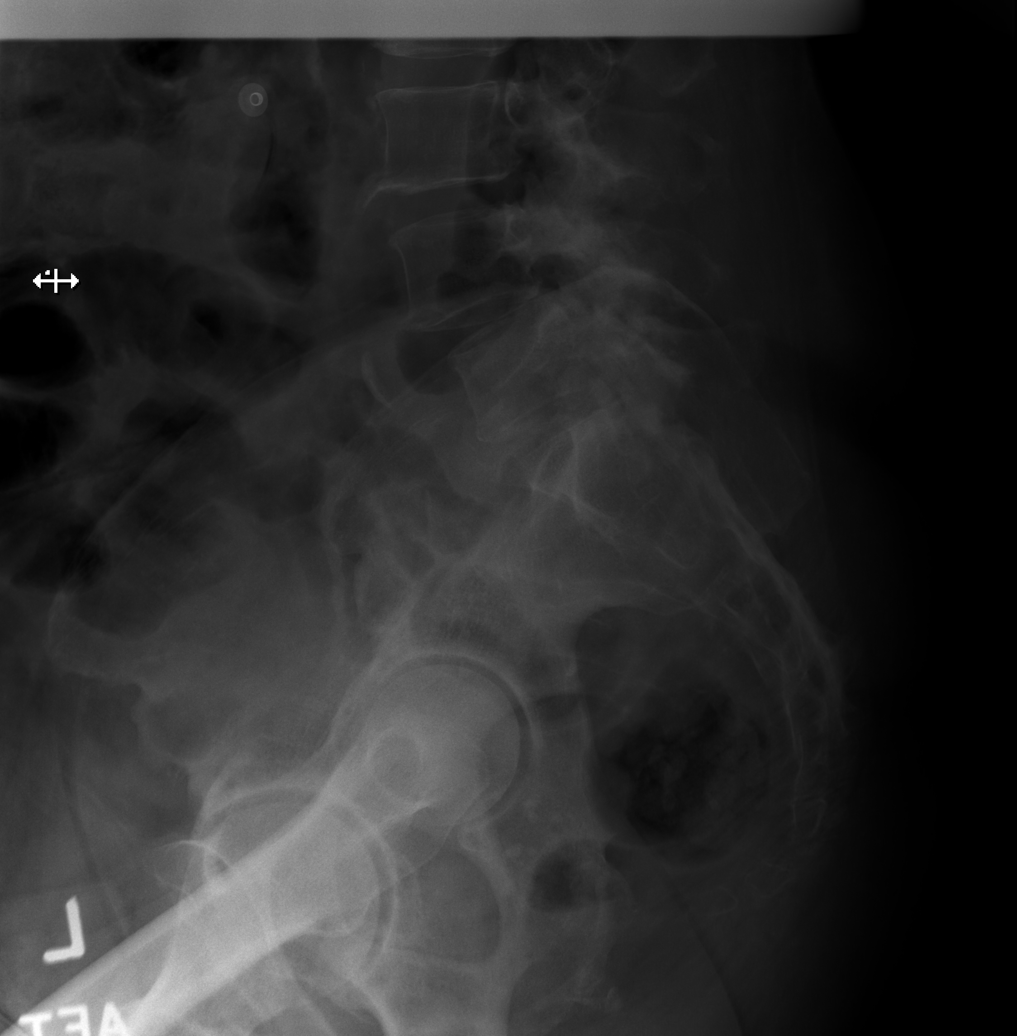

[5 of 5 positions shown; findings below may reference images not displayed]

FINDINGS: Four views study. No fracture or subluxation evident. Marked facet
degeneration noted lower lumbar levels loss of disc space at L4-5
and L5-S1. SI joints unremarkable.
IMPRESSION: Lower lumbar degenerative disc and facet disease. No acute findings.

## 2022-08-07 ENCOUNTER — Telehealth (INDEPENDENT_AMBULATORY_CARE_PROVIDER_SITE_OTHER): Payer: Self-pay | Admitting: Pharmacist

## 2022-08-07 ENCOUNTER — Telehealth (INDEPENDENT_AMBULATORY_CARE_PROVIDER_SITE_OTHER): Payer: Commercial Managed Care - HMO | Admitting: Primary Care

## 2022-08-07 DIAGNOSIS — Z1211 Encounter for screening for malignant neoplasm of colon: Secondary | ICD-10-CM

## 2022-08-07 DIAGNOSIS — Z13220 Encounter for screening for lipoid disorders: Secondary | ICD-10-CM

## 2022-08-07 DIAGNOSIS — I10 Essential (primary) hypertension: Secondary | ICD-10-CM

## 2022-08-07 DIAGNOSIS — Z131 Encounter for screening for diabetes mellitus: Secondary | ICD-10-CM

## 2022-08-07 DIAGNOSIS — D649 Anemia, unspecified: Secondary | ICD-10-CM | POA: Diagnosis not present

## 2022-08-07 NOTE — Progress Notes (Signed)
  [facility]  Telephone Note  I connected with Melanie Bradshaw, on 08/07/2022 at 3:47 PM telephone and verified that I am speaking with the correct person using two identifiers.   Consent: I discussed the limitations, risks, security and privacy concerns of performing an evaluation and management service by telephone and the availability of in person appointments. I also discussed with the patient that there may be a patient responsible charge related to this service. The patient expressed understanding and agreed to proceed.   Location of Patient: office  Location of Provider: [facility]   Persons participating in Telemedicine visit: Hulda Marin,  NP   History of Present Illness:  Melanie Bradshaw was seen in the office for labs and completed visit via phone. Patient has No headache, No chest pain, No abdominal pain - No Nausea, No new weakness tingling or numbness, No Cough - shortness of breath  Past Medical History:  Diagnosis Date   Eczema 2010   Hypertension    Morbid obesity (HCC)    Allergies  Allergen Reactions   Tomato     Current Outpatient Medications on File Prior to Visit  Medication Sig Dispense Refill   hydrOXYzine (ATARAX/VISTARIL) 10 MG tablet Take 10 mg by mouth at bedtime.     metoprolol succinate (TOPROL-XL) 25 MG 24 hr tablet Take 1 tablet (25 mg total) by mouth daily. (Patient not taking: Reported on 04/25/2022) 90 tablet 3   oxyCODONE (OXY IR/ROXICODONE) 5 MG immediate release tablet Take 1 tablet (5 mg total) by mouth every 8 (eight) hours as needed for breakthrough pain or moderate pain. 12 tablet 0   No current facility-administered medications on file prior to visit.    Observations/Objective: Last Menstrual Period 11/06/2013    Assessment and Plan: Diagnoses and all orders for this visit:  Screening for diabetes mellitus -     Hemoglobin A1c -     Microalbumin  / creatinine urine ratio  Essential hypertension -     CBC with Differential/Platelet -     CMP14+EGFR  Colon cancer screening -     Cologuard  Normocytic anemia -     CBC with Differential/Platelet -     Iron, TIBC and Ferritin Panel  Screening cholesterol level -     Lipid panel     Follow Up Instructions:    I discussed the assessment and treatment plan with the patient. The patient was provided an opportunity to ask questions and all were answered. The patient agreed with the plan and demonstrated an understanding of the instructions.   The patient was advised to call back or seek an in-person evaluation if the symptoms worsen or if the condition fails to improve as anticipated.     I provided 18 minutes total of non-face-to-face time during this encounter including median intraservice time, reviewing previous notes, investigations, ordering medications, medical decision making, coordinating care and patient verbalized understanding at the end of the visit.    This note has been created with Education officer, environmental. Any transcriptional errors are unintentional.   Grayce Sessions, NP 08/07/2022, 3:47 PM

## 2022-08-08 LAB — CBC WITH DIFFERENTIAL/PLATELET
Basophils Absolute: 0 10*3/uL (ref 0.0–0.2)
Basos: 1 %
EOS (ABSOLUTE): 0.4 10*3/uL (ref 0.0–0.4)
Eos: 10 %
Hematocrit: 33.5 % — ABNORMAL LOW (ref 34.0–46.6)
Hemoglobin: 11.5 g/dL (ref 11.1–15.9)
Immature Grans (Abs): 0 10*3/uL (ref 0.0–0.1)
Immature Granulocytes: 0 %
Lymphocytes Absolute: 0.6 10*3/uL — ABNORMAL LOW (ref 0.7–3.1)
Lymphs: 16 %
MCH: 35.2 pg — ABNORMAL HIGH (ref 26.6–33.0)
MCHC: 34.3 g/dL (ref 31.5–35.7)
MCV: 102 fL — ABNORMAL HIGH (ref 79–97)
Monocytes Absolute: 0.5 10*3/uL (ref 0.1–0.9)
Monocytes: 12 %
Neutrophils Absolute: 2.5 10*3/uL (ref 1.4–7.0)
Neutrophils: 61 %
Platelets: 182 10*3/uL (ref 150–450)
RBC: 3.27 x10E6/uL — ABNORMAL LOW (ref 3.77–5.28)
RDW: 14 % (ref 11.7–15.4)
WBC: 4 10*3/uL (ref 3.4–10.8)

## 2022-08-08 LAB — IRON,TIBC AND FERRITIN PANEL
Ferritin: 106 ng/mL (ref 15–150)
Iron Saturation: 90 % (ref 15–55)
Iron: 211 ug/dL — ABNORMAL HIGH (ref 27–159)
Total Iron Binding Capacity: 234 ug/dL — ABNORMAL LOW (ref 250–450)
UIBC: 23 ug/dL — ABNORMAL LOW (ref 131–425)

## 2022-08-08 LAB — LIPID PANEL
Chol/HDL Ratio: 1.5 ratio (ref 0.0–4.4)
Cholesterol, Total: 184 mg/dL (ref 100–199)
HDL: 126 mg/dL (ref >39)
LDL Chol Calc (NIH): 46 mg/dL (ref 0–99)
Triglycerides: 64 mg/dL (ref 0–149)
VLDL Cholesterol Cal: 12 mg/dL (ref 5–40)

## 2022-08-08 LAB — CMP14+EGFR
ALT: 18 IU/L (ref 0–32)
AST: 34 IU/L (ref 0–40)
Albumin: 3.5 g/dL — ABNORMAL LOW (ref 3.8–4.9)
Alkaline Phosphatase: 118 IU/L (ref 44–121)
BUN/Creatinine Ratio: 11 (ref 9–23)
BUN: 10 mg/dL (ref 6–24)
Bilirubin Total: 0.6 mg/dL (ref 0.0–1.2)
CO2: 21 mmol/L (ref 20–29)
Calcium: 8.9 mg/dL (ref 8.7–10.2)
Chloride: 97 mmol/L (ref 96–106)
Creatinine, Ser: 0.91 mg/dL (ref 0.57–1.00)
Globulin, Total: 3.7 g/dL (ref 1.5–4.5)
Glucose: 69 mg/dL — ABNORMAL LOW (ref 70–99)
Potassium: 4.3 mmol/L (ref 3.5–5.2)
Sodium: 132 mmol/L — ABNORMAL LOW (ref 134–144)
Total Protein: 7.2 g/dL (ref 6.0–8.5)
eGFR: 74 mL/min/{1.73_m2} (ref >59)

## 2022-08-08 LAB — HEMOGLOBIN A1C
Est. average glucose Bld gHb Est-mCnc: 88 mg/dL
Hgb A1c MFr Bld: 4.7 % — ABNORMAL LOW (ref 4.8–5.6)

## 2022-10-04 ENCOUNTER — Emergency Department (HOSPITAL_COMMUNITY)
Admission: EM | Admit: 2022-10-04 | Discharge: 2022-10-04 | Disposition: A | Discharge status: Home / Self Care | Payer: Commercial Managed Care - HMO | Attending: Emergency Medicine | Admitting: Emergency Medicine

## 2022-10-04 ENCOUNTER — Other Ambulatory Visit: Payer: Self-pay

## 2022-10-04 DIAGNOSIS — L309 Dermatitis, unspecified: Secondary | ICD-10-CM | POA: Insufficient documentation

## 2022-10-04 DIAGNOSIS — L299 Pruritus, unspecified: Secondary | ICD-10-CM | POA: Diagnosis present

## 2022-10-04 MED ORDER — HYDROXYZINE HCL 25 MG PO TABS: 25.0000 mg | ORAL_TABLET | Freq: Three times a day (TID) | ORAL | 0 refills | Status: DC | PRN

## 2022-10-04 MED ORDER — PREDNISONE 10 MG PO TABS: ORAL_TABLET | ORAL | 0 refills | Status: DC

## 2022-10-04 MED ORDER — DEXAMETHASONE SODIUM PHOSPHATE 10 MG/ML IJ SOLN
10.0000 mg | Freq: Once | INTRAMUSCULAR | Status: AC
  Administered 2022-10-04: 10 mg via INTRAMUSCULAR
  Filled 2022-10-04: qty 1

## 2022-10-04 MED ORDER — HYDROXYZINE HCL 25 MG PO TABS
25.0000 mg | ORAL_TABLET | Freq: Once | ORAL | Status: AC
  Administered 2022-10-04: 25 mg via ORAL
  Filled 2022-10-04: qty 1

## 2022-10-04 MED ORDER — TRIAMCINOLONE ACETONIDE 0.025 % EX OINT: 1.0000 | TOPICAL_OINTMENT | Freq: Two times a day (BID) | CUTANEOUS | 0 refills | Status: AC

## 2022-10-04 NOTE — ED Triage Notes (Signed)
Pt reports itching all over started this morning pt took 50mg  Benadryl pta.

## 2022-10-04 NOTE — ED Notes (Signed)
AVS with prescriptions provided to and discussed with patient. Pt verbalizes understanding of discharge instructions and denies any questions or concerns at this time. Pt ambulated out of department independently with steady gait. ? ?

## 2022-10-04 NOTE — ED Provider Notes (Signed)
[place] EMERGENCY DEPARTMENT AT [facility] Provider Note   CSN: [id number] Arrival date & time: 10/04/22  1230     History  Chief Complaint  Patient presents with   Pruritis    Melanie Bradshaw is a 57 y.o. female.  Patient complains of itching all over.  Patient has a history of eczema.  Patient states that she currently does not have any of the cream that she is to use.  Patient states she has been seen by dermatology in the past but does not currently have a dermatologist or a medical doctor.  Patient denies any fever or chills.  Patient has taken Benadryl at home without relief.  Patient denies any new complaints  The history is provided by the patient. No language interpreter was used.       Home Medications Prior to Admission medications   Medication Sig Start Date End Date Taking? Authorizing Provider  hydrOXYzine (ATARAX) 25 MG tablet Take 1 tablet (25 mg total) by mouth 3 (three) times daily as needed. 10/04/22  Yes Cheron Schaumann K, PA-C  predniSONE (DELTASONE) 10 MG tablet 6,5,4,3,2,1 taper 10/04/22  Yes Elson Areas, PA-C  triamcinolone (KENALOG) 0.025 % ointment Apply 1 Application topically 2 (two) times daily. 10/04/22 11/03/22 Yes Cheron Schaumann K, PA-C  metoprolol succinate (TOPROL-XL) 25 MG 24 hr tablet Take 1 tablet (25 mg total) by mouth daily. Patient not taking: Reported on 04/25/2022 04/05/20 03/31/21  Cant[place], Park Meo C, PA-C  oxyCODONE (OXY IR/ROXICODONE) 5 MG immediate release tablet Take 1 tablet (5 mg total) by mouth every 8 (eight) hours as needed for breakthrough pain or moderate pain. 04/24/22   Danford, Earl Lites, MD      Allergies    Tomato    Review of Systems   Review of Systems  Skin:  Positive for rash.  All other systems reviewed and are negative.   Physical Exam Updated Vital Signs BP 119/84 (BP Location: Right Arm)   Pulse 74   Temp 98.5 F (36.9 C) (Oral)   Resp 16   Ht 5\' 4"  (1.626 m)   Wt 75.3 kg   LMP 11/06/2013    SpO2 100%   BMI 28.49 kg/m  Physical Exam Vitals and nursing note reviewed.  Constitutional:      Appearance: She is well-developed.  HENT:     Head: Normocephalic.  Cardiovascular:     Rate and Rhythm: Normal rate.  Pulmonary:     Effort: Pulmonary effort is normal.  Abdominal:     General: There is no distension.  Musculoskeletal:        General: Normal range of motion.     Cervical back: Normal range of motion.  Skin:    Findings: Rash present.     Comments: Eczematous rash full body red scratch marks.  Neurological:     General: No focal deficit present.     Mental Status: She is alert and oriented to person, place, and time.  Psychiatric:        Mood and Affect: Mood normal.     ED Results / Procedures / Treatments   Labs (all labs ordered are listed, but only abnormal results are displayed) Labs Reviewed - No data to display  EKG None  Radiology No results found.  Procedures Procedures    Medications Ordered in ED Medications  hydrOXYzine (ATARAX) tablet 25 mg (25 mg Oral Given 10/04/22 1329)  dexamethasone (DECADRON) injection 10 mg (10 mg Intramuscular Given 10/04/22 1329)  ED Course/ Medical Decision Making/ A&P                                 Medical Decision Making Pt complains of a full body rash  Risk Prescription drug management. Risk Details: Patient is given a prescription for Atarax prednisone and Kenalog cream.  Patient is given Atarax and Solu-Medrol here.           Final Clinical Impression(s) / ED Diagnoses Final diagnoses:  Eczema, unspecified type  Itching    Rx / DC Orders ED Discharge Orders          Ordered    hydrOXYzine (ATARAX) 25 MG tablet  3 times daily PRN        10/04/22 1319    predniSONE (DELTASONE) 10 MG tablet       Note to Pharmacy: Please provide taper dose pack   10/04/22 1319    triamcinolone (KENALOG) 0.025 % ointment  2 times daily        10/04/22 1319           An After Visit Summary  was printed and given to the patient.    Elson Areas, Cordelia Poche 10/04/22 1410    Benjiman Core, MD 10/04/22 1526

## 2022-11-13 ENCOUNTER — Ambulatory Visit (INDEPENDENT_AMBULATORY_CARE_PROVIDER_SITE_OTHER): Payer: Commercial Managed Care - HMO | Admitting: Primary Care

## 2022-11-21 ENCOUNTER — Encounter (INDEPENDENT_AMBULATORY_CARE_PROVIDER_SITE_OTHER): Payer: Self-pay | Admitting: Primary Care

## 2022-11-21 ENCOUNTER — Ambulatory Visit (INDEPENDENT_AMBULATORY_CARE_PROVIDER_SITE_OTHER): Payer: Commercial Managed Care - HMO | Admitting: Primary Care

## 2022-11-21 VITALS — BP 115/82 | HR 76 | Resp 16 | Wt 159.2 lb

## 2022-11-21 DIAGNOSIS — R000 Tachycardia, unspecified: Secondary | ICD-10-CM

## 2022-11-21 DIAGNOSIS — L249 Irritant contact dermatitis, unspecified cause: Secondary | ICD-10-CM

## 2022-11-21 DIAGNOSIS — Z1231 Encounter for screening mammogram for malignant neoplasm of breast: Secondary | ICD-10-CM

## 2022-11-21 DIAGNOSIS — Z1211 Encounter for screening for malignant neoplasm of colon: Secondary | ICD-10-CM

## 2022-11-21 DIAGNOSIS — H547 Unspecified visual loss: Secondary | ICD-10-CM

## 2022-11-21 MED ORDER — TRIAMCINOLONE ACETONIDE 0.1 % EX CREA: 1.0000 | TOPICAL_CREAM | Freq: Two times a day (BID) | CUTANEOUS | 1 refills | Status: AC

## 2022-11-21 MED ORDER — HYDROXYZINE HCL 25 MG PO TABS: 25.0000 mg | ORAL_TABLET | Freq: Three times a day (TID) | ORAL | 1 refills | Status: AC | PRN

## 2022-11-21 NOTE — Progress Notes (Signed)
[facility]  Melanie Bradshaw, is a 57 y.o. female  HQI:[id number]  XLK:[id number]  DOB - 08/24/1965  Chief Complaint  Patient presents with   Follow-up    3 month        Subjective:   Melanie Bradshaw is a 57 y.o. female here today for a follow up visit. Patient has No headache, No chest pain, No abdominal pain - No Nausea, No new weakness tingling or numbness, No Cough - shortness of breath  No problems updated.  Allergies  Allergen Reactions   Tomato     Past Medical History:  Diagnosis Date   Eczema 2010   Hypertension    Morbid obesity (HCC)     Current Outpatient Medications on File Prior to Visit  Medication Sig Dispense Refill   metoprolol succinate (TOPROL-XL) 25 MG 24 hr tablet Take 1 tablet (25 mg total) by mouth daily. (Patient not taking: Reported on 04/25/2022) 90 tablet 3   oxyCODONE (OXY IR/ROXICODONE) 5 MG immediate release tablet Take 1 tablet (5 mg total) by mouth every 8 (eight) hours as needed for breakthrough pain or moderate pain. 12 tablet 0   predniSONE (DELTASONE) 10 MG tablet 6,5,4,3,2,1 taper 21 tablet 0   No current facility-administered medications on file prior to visit.    Objective:   Vitals:   11/21/22 1509  BP: 115/82  Pulse: 76  Resp: 16  SpO2: 100%  Weight: 159 lb 3.2 oz (72.2 kg)    Comprehensive ROS Pertinent positive and negative noted in HPI   Exam General appearance : Awake, alert, not in any distress. Speech Clear. Not toxic looking HEENT: Atraumatic and Normocephalic, pupils equally reactive to light and accomodation Neck: Supple, no JVD. No cervical lymphadenopathy.  Chest: Good air entry bilaterally, no added sounds  CVS: S1 S2 regular, no murmurs.  Abdomen: Bowel sounds present, Non tender and not distended with no gaurding, rigidity or rebound. Extremities: B/L Lower Ext shows no edema, both legs are warm to touch Neurology: Awake alert, and oriented X 3, CN II-XII intact, Non focal Skin: No  Rash  Data Review Lab Results  Component Value Date   HGBA1C 4.7 (L) 08/07/2022   HGBA1C 4.8 07/08/2020   HGBA1C 5.3 06/20/2019    Assessment & Plan   Melanie Bradshaw was seen today for follow-up.  Diagnoses and all orders for this visit:  Colon cancer screening -     Ambulatory referral to Gastroenterology  Encounter for screening mammogram for malignant neoplasm of breast -     MM DIGITAL SCREENING BILATERAL; Future  Vision problems -     Ambulatory referral to Ophthalmology  Irritant contact dermatitis, unspecified trigger -     triamcinolone cream (KENALOG) 0.1 %; Apply 1 Application topically 2 (two) times daily.  Other orders -     hydrOXYzine (ATARAX) 25 MG tablet; Take 1 tablet (25 mg total) by mouth 3 (three) times daily as needed.  Tachycardia  Not taking metoprolol HR today 76 denies any racing heart beats   Patient have been counseled extensively about nutrition and exercise. Other issues discussed during this visit include: low cholesterol diet, weight control and daily exercise, foot care, annual eye examinations at Ophthalmology, importance of adherence with medications and regular follow-up. We also discussed long term complications of uncontrolled diabetes and hypertension.   No follow-ups on file.  The patient was given clear instructions to go to ER or return to medical center if symptoms don't improve, worsen or new problems develop. The patient  verbalized understanding. The patient was told to call to get lab results if they haven't heard anything in the next week.   This note has been created with Education officer, environmental. Any transcriptional errors are unintentional.   Grayce Sessions, NP 11/21/2022, 3:33 PM

## 2022-11-21 NOTE — Patient Instructions (Signed)

## 2022-12-01 NOTE — Telephone Encounter (Signed)
Confirmed with pt.

## 2022-12-21 ENCOUNTER — Inpatient Hospital Stay: Admission: RE | Admit: 2022-12-21 | Payer: Commercial Managed Care - HMO | Source: Ambulatory Visit

## 2023-01-09 ENCOUNTER — Encounter: Payer: Self-pay | Admitting: Primary Care

## 2023-01-26 ENCOUNTER — Ambulatory Visit
Admission: RE | Admit: 2023-01-26 | Discharge: 2023-01-26 | Disposition: A | Discharge status: Home / Self Care | Payer: Commercial Managed Care - HMO | Source: Ambulatory Visit | Attending: Primary Care | Admitting: Primary Care

## 2023-01-26 ENCOUNTER — Ambulatory Visit: Payer: Commercial Managed Care - HMO

## 2023-01-26 DIAGNOSIS — Z1231 Encounter for screening mammogram for malignant neoplasm of breast: Secondary | ICD-10-CM

## 2023-02-21 ENCOUNTER — Other Ambulatory Visit: Payer: Self-pay

## 2023-02-21 ENCOUNTER — Encounter (HOSPITAL_COMMUNITY): Payer: Self-pay

## 2023-02-21 ENCOUNTER — Emergency Department (HOSPITAL_COMMUNITY)
Admission: EM | Admit: 2023-02-21 | Discharge: 2023-02-21 | Disposition: A | Discharge status: Home / Self Care | Payer: Medicaid Other | Attending: Emergency Medicine | Admitting: Emergency Medicine

## 2023-02-21 DIAGNOSIS — R220 Localized swelling, mass and lump, head: Secondary | ICD-10-CM | POA: Insufficient documentation

## 2023-02-21 DIAGNOSIS — R21 Rash and other nonspecific skin eruption: Secondary | ICD-10-CM

## 2023-02-21 DIAGNOSIS — L539 Erythematous condition, unspecified: Secondary | ICD-10-CM | POA: Diagnosis not present

## 2023-02-21 DIAGNOSIS — R0981 Nasal congestion: Secondary | ICD-10-CM | POA: Insufficient documentation

## 2023-02-21 LAB — CBC WITH DIFFERENTIAL/PLATELET
Abs Immature Granulocytes: 0.01 10*3/uL (ref 0.00–0.07)
Basophils Absolute: 0 10*3/uL (ref 0.0–0.1)
Basophils Relative: 1 %
Eosinophils Absolute: 0.4 10*3/uL (ref 0.0–0.5)
Eosinophils Relative: 9 %
HCT: 28.8 % — ABNORMAL LOW (ref 36.0–46.0)
Hemoglobin: 8.9 g/dL — ABNORMAL LOW (ref 12.0–15.0)
Immature Granulocytes: 0 %
Lymphocytes Relative: 15 %
Lymphs Abs: 0.6 10*3/uL — ABNORMAL LOW (ref 0.7–4.0)
MCH: 33.3 pg (ref 26.0–34.0)
MCHC: 30.9 g/dL (ref 30.0–36.0)
MCV: 107.9 fL — ABNORMAL HIGH (ref 80.0–100.0)
Monocytes Absolute: 0.6 10*3/uL (ref 0.1–1.0)
Monocytes Relative: 15 %
Neutro Abs: 2.3 10*3/uL (ref 1.7–7.7)
Neutrophils Relative %: 60 %
Platelets: 154 10*3/uL (ref 150–400)
RBC: 2.67 MIL/uL — ABNORMAL LOW (ref 3.87–5.11)
RDW: 13.5 % (ref 11.5–15.5)
WBC: 3.9 10*3/uL — ABNORMAL LOW (ref 4.0–10.5)
nRBC: 0 % (ref 0.0–0.2)

## 2023-02-21 MED ORDER — PREDNISONE 10 MG PO TABS: 20.0000 mg | ORAL_TABLET | Freq: Every day | ORAL | 0 refills | Status: AC

## 2023-02-21 MED ORDER — TRIAMCINOLONE ACETONIDE 0.1 % EX LOTN
TOPICAL_LOTION | Freq: Once | CUTANEOUS | Status: AC
  Filled 2023-02-21: qty 1

## 2023-02-21 MED ORDER — METHYLPREDNISOLONE SODIUM SUCC 125 MG IJ SOLR
125.0000 mg | Freq: Once | INTRAMUSCULAR | Status: AC
  Administered 2023-02-21: 125 mg via INTRAVENOUS
  Filled 2023-02-21: qty 2

## 2023-02-21 MED ORDER — FAMOTIDINE IN NACL 20-0.9 MG/50ML-% IV SOLN
20.0000 mg | Freq: Once | INTRAVENOUS | Status: AC
  Administered 2023-02-21: 20 mg via INTRAVENOUS
  Filled 2023-02-21: qty 50

## 2023-02-21 MED ORDER — DIPHENHYDRAMINE HCL 50 MG/ML IJ SOLN
25.0000 mg | Freq: Once | INTRAMUSCULAR | Status: DC
  Filled 2023-02-21: qty 1

## 2023-02-21 MED ORDER — SODIUM CHLORIDE 0.9 % IV SOLN
25.0000 mg | INTRAVENOUS | Status: DC | PRN
  Administered 2023-02-21: 25 mg via INTRAVENOUS
  Filled 2023-02-21: qty 25

## 2023-02-21 MED ORDER — SODIUM CHLORIDE 0.9 % IV BOLUS
1000.0000 mL | Freq: Once | INTRAVENOUS | Status: AC
  Administered 2023-02-21: 1000 mL via INTRAVENOUS

## 2023-02-21 MED ORDER — DIPHENHYDRAMINE HCL 25 MG PO TABS: 25.0000 mg | ORAL_TABLET | Freq: Four times a day (QID) | ORAL | 0 refills | Status: AC

## 2023-02-21 NOTE — Discharge Instructions (Signed)
You have been seen and discharged from the emergency department.  You were found to have facial swelling/rash but responded to treatment.  Continue taking Benadryl and steroid as directed.  Stay well-hydrated.  You may continue to use your home triamcinolone cream as directed.  Follow-up with your primary provider for further evaluation and further care. Take home medications as prescribed. If you have any worsening symptoms or further concerns for your health please return to an emergency department for further evaluation.

## 2023-02-21 NOTE — ED Triage Notes (Addendum)
Coming from home. Noticed swelling in her face yesterday. Eyes are swollen this morning. No stridor, no throat swelling. 50 mg of benadryl given EMS and also took 2 benadryl at home last night. This happened before once in September. Hx of eczema. Denies -pril medications. Pt states she is very itchy  126/83 BP HR 66 97% o2

## 2023-02-21 NOTE — ED Provider Notes (Signed)
[place] EMERGENCY DEPARTMENT AT [facility] [address] Provider Note   CSN: [id number] Arrival date & time: 02/21/23  0825     History  Chief Complaint  Patient presents with   Facial Swelling    Melanie Bradshaw is a 58 y.o. female.  HPI   58 year old female with past medical history of eczema that does affect the face, she uses a low-dose steroid cream presents to the emergency department with worsening swelling of her face, eyes.  She states that she started feeling itchy and uncomfortable last night, took a dose of Benadryl.  When she woke up this morning the itching and swelling has extended to her entire face.  She denies any tongue swelling or difficulty swallowing.  No itchiness in the throat, no change in voice.  Denies any new medications, specifically blood pressure medications. States her whole body is itchy.  Home Medications Prior to Admission medications   Medication Sig Start Date End Date Taking? Authorizing Provider  hydrOXYzine (ATARAX) 25 MG tablet Take 1 tablet (25 mg total) by mouth 3 (three) times daily as needed. 11/21/22   Grayce Sessions, NP  oxyCODONE (OXY IR/ROXICODONE) 5 MG immediate release tablet Take 1 tablet (5 mg total) by mouth every 8 (eight) hours as needed for breakthrough pain or moderate pain. 04/24/22   Alberteen Sam, MD  predniSONE (DELTASONE) 10 MG tablet 6,5,4,3,2,1 taper 10/04/22   Elson Areas, PA-C  triamcinolone cream (KENALOG) 0.1 % Apply 1 Application topically 2 (two) times daily. 11/21/22   Grayce Sessions, NP      Allergies    Tomato    Review of Systems   Review of Systems  Constitutional:  Negative for fever.  HENT:  Positive for facial swelling. Negative for sore throat, trouble swallowing and voice change.   Respiratory:  Negative for shortness of breath.   Cardiovascular:  Negative for chest pain.  Gastrointestinal:  Negative for abdominal pain, nausea and vomiting.  Skin:  Positive for rash.       Whole  body itchiness  Neurological:  Negative for headaches.    Physical Exam Updated Vital Signs BP 108/76   Pulse 66   Temp 97.9 F (36.6 C) (Oral)   Resp 14   Ht 5\' 4"  (1.626 m)   LMP 11/06/2013   SpO2 100%   BMI 27.33 kg/m  Physical Exam Vitals and nursing note reviewed.  Constitutional:      General: She is not in acute distress.    Appearance: Normal appearance.  HENT:     Head: Normocephalic.     Comments: Swollen face, eyelids, lower lip with erythematous and dry and in some areas excoriated skin    Nose: Congestion present.     Mouth/Throat:     Mouth: Mucous membranes are moist.     Comments: Tongue is unremarkable, uvula is midline and not swollen, no stridor, speaking and swallowing freely Cardiovascular:     Rate and Rhythm: Normal rate.  Pulmonary:     Effort: Pulmonary effort is normal. No respiratory distress.     Breath sounds: No wheezing.  Abdominal:     Palpations: Abdomen is soft.  Skin:    General: Skin is warm.     Comments: Erythematous and itchy skin along the face and dorsal aspect of the hands  Neurological:     Mental Status: She is alert and oriented to person, place, and time. Mental status is at baseline.     ED Results /  Procedures / Treatments   Labs (all labs ordered are listed, but only abnormal results are displayed) Labs Reviewed  CBC WITH DIFFERENTIAL/PLATELET  BASIC METABOLIC PANEL    EKG None  Radiology No results found.  Procedures Procedures    Medications Ordered in ED Medications  sodium chloride 0.9 % bolus 1,000 mL (has no administration in time range)  diphenhydrAMINE (BENADRYL) injection 25 mg (has no administration in time range)  methylPREDNISolone sodium succinate (SOLU-MEDROL) 125 mg/2 mL injection 125 mg (has no administration in time range)  famotidine (PEPCID) IVPB 20 mg premix (has no administration in time range)  triamcinolone lotion (KENALOG) 0.1 % (has no administration in time range)    ED  Course/ Medical Decision Making/ A&P                                 Medical Decision Making Amount and/or Complexity of Data Reviewed Labs: ordered.  Risk OTC drugs. Prescription drug management.   58 year old female presents emergency department facial swelling, itchy rash.  History of eczema, she does use a steroid cream prescribed by dermatologist.  She has periorbital swelling and some facial swelling with erythema and itching/excoriations.  There is no mouth involvement.  No other symptoms of anaphylactic reaction.  She is not on any blood pressure/prole medications.  After IV Benadryl and steroids the swelling is significantly improved.  She continues to have itching.  Her evaluation seems more consistent with a dermatologic issue than genuine angioedema.  She has had facial swelling and rash before just never this extensive.  Will continue Benadryl, steroids and encourage the continued use of her prescribed topical medication and follow-up with dermatology.  She looks much improved, almost back to baseline.  Able to eat and drink without difficulty.  Again doubt allergic reaction.  Patient at this time appears safe and stable for discharge and close outpatient follow up. Discharge plan and strict return to ED precautions discussed, patient verbalizes understanding and agreement.        Final Clinical Impression(s) / ED Diagnoses Final diagnoses:  None    Rx / DC Orders ED Discharge Orders     None         Rozelle Logan, DO 02/21/23 1547

## 2023-02-21 NOTE — ED Notes (Signed)
Pt only allowed me to put Kenalog on left cheek. Pt said it burns and she will finish doing it herself.

## 2023-02-22 ENCOUNTER — Ambulatory Visit (INDEPENDENT_AMBULATORY_CARE_PROVIDER_SITE_OTHER): Payer: Medicaid Other | Admitting: Primary Care

## 2023-02-22 ENCOUNTER — Encounter (INDEPENDENT_AMBULATORY_CARE_PROVIDER_SITE_OTHER): Payer: Self-pay | Admitting: Primary Care

## 2023-02-22 VITALS — BP 131/82 | HR 65 | Resp 16 | Ht 64.0 in | Wt 165.6 lb

## 2023-02-22 DIAGNOSIS — L249 Irritant contact dermatitis, unspecified cause: Secondary | ICD-10-CM | POA: Diagnosis not present

## 2023-02-22 DIAGNOSIS — D649 Anemia, unspecified: Secondary | ICD-10-CM

## 2023-02-22 DIAGNOSIS — Z09 Encounter for follow-up examination after completed treatment for conditions other than malignant neoplasm: Secondary | ICD-10-CM | POA: Diagnosis not present

## 2023-02-22 NOTE — Progress Notes (Signed)
Subjective:   [name] is a 58 y.o. female presents for ED follow up on 02/21/23, patient was discharged with Facial swelling and Rash. Tx with prednisone and benadryl. Today she still feels like something is crawling and itching. Patient has No headache, No chest pain, No abdominal pain - No Nausea, No new weakness tingling or numbness, No Cough - shortness of breath  Past Medical History:  Diagnosis Date   Eczema 2010   Hypertension    Morbid obesity (HCC)      Allergies  Allergen Reactions   Tomato     Current Outpatient Medications on File Prior to Visit  Medication Sig Dispense Refill   diphenhydrAMINE (BENADRYL) 25 MG tablet Take 1 tablet (25 mg total) by mouth every 6 (six) hours. 20 tablet 0   hydrOXYzine (ATARAX) 25 MG tablet Take 1 tablet (25 mg total) by mouth 3 (three) times daily as needed. 90 tablet 1   predniSONE (DELTASONE) 10 MG tablet Take 2 tablets (20 mg total) by mouth daily. Take 2 tablets, 20 mg total by mouth daily for 5 days.  Then take 1 tablet, 10 mg total by mouth for the remainder 5 days. 15 tablet 0   triamcinolone cream (KENALOG) 0.1 % Apply 1 Application topically 2 (two) times daily. 454 g 1   No current facility-administered medications on file prior to visit.    Review of System: Comprehensive ROS Pertinent positive and negative noted in HPI   Objective:  BP 131/82   Pulse 65   Resp 16   Ht 5\' 4"  (1.626 m)   Wt 165 lb 9.6 oz (75.1 kg)   LMP 11/06/2013   SpO2 100%   BMI 28.43 kg/m   Filed Weights   02/22/23 1217  Weight: 165 lb 9.6 oz (75.1 kg)    Physical Exam Vitals reviewed.  HENT:     Head: Normocephalic.     Right Ear: Tympanic membrane, ear canal and external ear normal.     Left Ear: Tympanic membrane, ear canal and external ear normal.     Nose: Nose normal.     Mouth/Throat:     Mouth: Mucous membranes are moist.  Eyes:     Extraocular Movements: Extraocular movements intact.     Pupils: Pupils are equal, round,  and reactive to light.  Cardiovascular:     Rate and Rhythm: Normal rate and regular rhythm.  Pulmonary:     Effort: Pulmonary effort is normal.     Breath sounds: Normal breath sounds.  Abdominal:     General: Bowel sounds are normal.     Palpations: Abdomen is soft.  Musculoskeletal:        General: Normal range of motion.     Cervical back: Normal range of motion.  Skin:    Findings: Rash present.     Comments: Depigmentation on hands   Neurological:     Mental Status: She is alert and oriented to person, place, and time.  Psychiatric:        Mood and Affect: Mood normal.        Behavior: Behavior normal.        Thought Content: Thought content normal.      Assessment:  Melanie Bradshaw was seen today for annual exam, hospitalization follow-up and referral.  Diagnoses and all orders for this visit:  Normocytic anemia -     Hemoglobin A1c -     Microalbumin / creatinine urine ratio -     CMP14+EGFR -  Iron, TIBC and Ferritin Panel -     CBC with Differential; Future  Irritant contact dermatitis, unspecified trigger -     Ambulatory referral to Dermatology -     CBC with Differential; Future      Hospital discharge follow-up See HPI    This note has been created with Dragon speech recognition software and Paediatric nurse. Any transcriptional errors are unintentional.   Return in about 3 months (around 05/23/2023) for medical conditions.  Melanie Sessions, NP 02/22/2023, 8:44 PM

## 2023-02-23 LAB — MICROALBUMIN / CREATININE URINE RATIO
Creatinine, Urine: 194.6 mg/dL
Microalb/Creat Ratio: 5 mg/g{creat} (ref 0–29)
Microalbumin, Urine: 9.7 ug/mL

## 2023-02-23 LAB — CMP14+EGFR
ALT: 15 [IU]/L (ref 0–32)
AST: 21 [IU]/L (ref 0–40)
Albumin: 4.3 g/dL (ref 3.8–4.9)
Alkaline Phosphatase: 102 [IU]/L (ref 44–121)
BUN/Creatinine Ratio: 17 (ref 9–23)
BUN: 16 mg/dL (ref 6–24)
Bilirubin Total: 0.4 mg/dL (ref 0.0–1.2)
CO2: 21 mmol/L (ref 20–29)
Calcium: 9.9 mg/dL (ref 8.7–10.2)
Chloride: 101 mmol/L (ref 96–106)
Creatinine, Ser: 0.92 mg/dL (ref 0.57–1.00)
Globulin, Total: 4 g/dL (ref 1.5–4.5)
Glucose: 102 mg/dL — ABNORMAL HIGH (ref 70–99)
Potassium: 4.1 mmol/L (ref 3.5–5.2)
Sodium: 137 mmol/L (ref 134–144)
Total Protein: 8.3 g/dL (ref 6.0–8.5)
eGFR: 73 mL/min/{1.73_m2} (ref >59)

## 2023-02-23 LAB — HEMOGLOBIN A1C
Est. average glucose Bld gHb Est-mCnc: 100 mg/dL
Hgb A1c MFr Bld: 5.1 % (ref 4.8–5.6)

## 2023-02-23 LAB — IRON,TIBC AND FERRITIN PANEL
Ferritin: 164 ng/mL — ABNORMAL HIGH (ref 15–150)
Iron Saturation: 25 % (ref 15–55)
Iron: 76 ug/dL (ref 27–159)
Total Iron Binding Capacity: 301 ug/dL (ref 250–450)
UIBC: 225 ug/dL (ref 131–425)

## 2023-04-10 NOTE — Progress Notes (Deleted)
 [facility] Initial Consultation   Referring Provider Grayce Sessions, NP [address] [place],  [place] [address]  Primary Care Provider Grayce Sessions, NP  Patient Profile: Melanie Bradshaw is a 58 y.o. female with a past medical history noteworthy for HTN, SVT, obesity UTI who is seen in consultation in the [facility] at the request of Dr. Randa Evens for evaluation and management of the problem(s) noted below.  Problem List: Colon cancer screening   History of Present Illness   Melanie Bradshaw is a 58 y.o. female with a history of ***.   Last colonoscopy: None Last endoscopy: None  Last Abd CT/CTE/MRE: None  GI Review of Symptoms Significant for {GIROS:50592}. Otherwise negative.  General Review of Systems  Review of systems is significant for the pertinent positives and negatives as listed per the HPI.  Full ROS is otherwise negative.  Past Medical History   Past Medical History:  Diagnosis Date   Eczema 2010   Hypertension    Morbid obesity (HCC)      Past Surgical History   Past Surgical History:  Procedure Laterality Date   CESAREAN SECTION       Allergies and Medications   Allergies  Allergen Reactions   Tomato     @MEDSTODAY @  Family History   Family History  Problem Relation Age of Onset   Diabetes Mother    Hypertension Mother    Diabetes Sister    Diabetes Brother      Social History   Social History   Tobacco Use   Smoking status: Never   Smokeless tobacco: Never  Vaping Use   Vaping status: Never Used  Substance Use Topics   Alcohol use: Yes    Alcohol/week: 3.0 standard drinks of alcohol    Types: 3 Cans of beer per week    Comment: SOCIAL   Drug use: Yes    Types: Marijuana   Exa reports that she has never smoked. She has never used smokeless tobacco. She reports current alcohol use of about 3.0 standard drinks of alcohol per week. She reports current drug use. Drug: Marijuana.  Vital  Signs and Physical Examination  There were no vitals filed for this visit. There is no height or weight on file to calculate BMI.    General: Well developed, well nourished, no acute distress Head: Normocephalic and atraumatic Eyes: Sclerae anicteric, EOMI Ears: Normal auditory acuity Mouth: No deformities or lesions noted Lungs: Clear throughout to auscultation Heart: Regular rate and rhythm; No murmurs, rubs or bruits Abdomen: Soft, non tender and non distended. No masses, hepatosplenomegaly or hernias noted. Normal Bowel sounds Rectal: Musculoskeletal: Symmetrical with no gross deformities  Pulses:  Normal pulses noted Extremities: No edema or deformities noted Neurological: Alert oriented x 4, grossly nonfocal Psychological:  Alert and cooperative. Normal mood and affect  Review of Data  The following data was reviewed at the time of this encounter:  Laboratory Studies      Latest Ref Rng & Units 02/21/2023   10:00 AM 08/07/2022    2:01 PM 04/24/2022    5:09 AM  CBC  WBC 4.0 - 10.5 K/uL 3.9  4.0  4.2   Hemoglobin 12.0 - 15.0 g/dL 8.9  60.4  9.0   Hematocrit 36.0 - 46.0 % 28.8  33.5  26.6   Platelets 150 - 400 K/uL 154  182  281     No results found for: "LIPASE"    Latest Ref Rng & Units 02/22/2023  12:40 PM 08/07/2022    2:01 PM 04/24/2022    5:09 AM  CMP  Glucose 70 - 99 mg/dL 409  69  811   BUN 6 - 24 mg/dL 16  10  7    Creatinine 0.57 - 1.00 mg/dL 9.14  7.82  9.56   Sodium 134 - 144 mmol/L 137  132  128   Potassium 3.5 - 5.2 mmol/L 4.1  4.3  3.5   Chloride 96 - 106 mmol/L 101  97  99   CO2 20 - 29 mmol/L 21  21  20    Calcium 8.7 - 10.2 mg/dL 9.9  8.9  7.9   Total Protein 6.0 - 8.5 g/dL 8.3  7.2  6.4   Total Bilirubin 0.0 - 1.2 mg/dL 0.4  0.6  0.8   Alkaline Phos 44 - 121 IU/L 102  118  94   AST 0 - 40 IU/L 21  34  18   ALT 0 - 32 IU/L 15  18  11       Imaging Studies    GI Procedures and Studies      Clinical Impression  It is my clinical impression  that Melanie Bradshaw is a 58 y.o. female with;  ***  Plan  *** *** *** *** ***  Planned Follow Up No follow-ups on file.  The patient or caregiver verbalized understanding of the material covered, with no barriers to understanding. All questions were answered. Patient or caregiver is agreeable with the plan outlined above.    It was a pleasure to see Melanie Bradshaw.  If you have any questions or concerns regarding this evaluation, do not hesitate to contact me.  Maren Beach, MD [facility]

## 2023-04-12 ENCOUNTER — Ambulatory Visit: Payer: Commercial Managed Care - HMO | Admitting: Pediatrics

## 2023-04-12 DIAGNOSIS — Z1211 Encounter for screening for malignant neoplasm of colon: Secondary | ICD-10-CM

## 2023-05-23 ENCOUNTER — Ambulatory Visit: Admitting: Physician Assistant

## 2023-05-23 NOTE — Progress Notes (Deleted)
 05/23/2023 Melanie Bradshaw [id number] 07-15-1965  Referring provider: [name], NP Primary GI doctor: {acdocs:[id number]}  ASSESSMENT AND PLAN:   Assessment and Plan          Screening for colon cancer  Protein malnutrition, moderate  Macrocytic anemia with leukopenia, normal platelets 02/21/2023  HGB 8.9 MCV 107.9 Platelets 154 02/22/2023 Iron 76 Ferritin 164 B12 233 Recent Labs    08/07/22 1401 02/21/23 1000  HGB 11.5 8.9*  Possibly secondary to B12 deficiency, alcohol use, nutritional deficiency   Remote elevated liver function 2022 in setting of alcohol use AST 42, total bilirubin 1.5, alk phos 77 unremarkable liver labs since that time has had mild low albumin Platelets 154 No recent imaging remote CT 2012  Alcohol use  Patient Care Team: [name], NP as PCP - General (Internal Medicine)  HISTORY OF PRESENT ILLNESS: 58 y.o. female with a past medical history listed below presents for evaluation of ***.   *** Discussed the use of AI scribe software for clinical note transcription with the patient, who gave verbal consent to proceed.  History of Present Illness            She  reports that she has never smoked. She has never used smokeless tobacco. She reports current alcohol use of about 3.0 standard drinks of alcohol per week. She reports current drug use. Drug: Marijuana.  RELEVANT GI HISTORY, IMAGING AND LABS: Results         03/29/2020 echocardiogram normal ejection fraction, unremarkable valves 04/14/2020 low risk myocardial perfusion study CBC    Component Value Date/Time   WBC 3.9 (L) 02/21/2023 1000   RBC 2.67 (L) 02/21/2023 1000   HGB 8.9 (L) 02/21/2023 1000   HGB 11.5 08/07/2022 1401   HCT 28.8 (L) 02/21/2023 1000   HCT 33.5 (L) 08/07/2022 1401   PLT 154 02/21/2023 1000   PLT 182 08/07/2022 1401   MCV 107.9 (H) 02/21/2023 1000   MCV 102 (H) 08/07/2022 1401   MCH 33.3 02/21/2023 1000   MCHC 30.9 02/21/2023 1000   RDW  13.5 02/21/2023 1000   RDW 14.0 08/07/2022 1401   LYMPHSABS 0.6 (L) 02/21/2023 1000   LYMPHSABS 0.6 (L) 08/07/2022 1401   MONOABS 0.6 02/21/2023 1000   EOSABS 0.4 02/21/2023 1000   EOSABS 0.4 08/07/2022 1401   BASOSABS 0.0 02/21/2023 1000   BASOSABS 0.0 08/07/2022 1401   Recent Labs    08/07/22 1401 02/21/23 1000  HGB 11.5 8.9*    CMP     Component Value Date/Time   NA 137 02/22/2023 1240   K 4.1 02/22/2023 1240   CL 101 02/22/2023 1240   CO2 21 02/22/2023 1240   GLUCOSE 102 (H) 02/22/2023 1240   GLUCOSE 101 (H) 04/24/2022 0509   BUN 16 02/22/2023 1240   CREATININE 0.92 02/22/2023 1240   CREATININE 0.78 03/02/2014 1039   CALCIUM 9.9 02/22/2023 1240   PROT 8.3 02/22/2023 1240   ALBUMIN 4.3 02/22/2023 1240   AST 21 02/22/2023 1240   ALT 15 02/22/2023 1240   ALKPHOS 102 02/22/2023 1240   BILITOT 0.4 02/22/2023 1240   GFRNONAA >60 04/24/2022 0509   GFRNONAA >89 03/02/2014 1039   GFRAA 103 11/25/2019 1005   GFRAA >89 03/02/2014 1039      Latest Ref Rng & Units 02/22/2023   12:40 PM 08/07/2022    2:01 PM 04/24/2022    5:09 AM  Hepatic Function  Total Protein 6.0 - 8.5 g/dL 8.3  7.2  6.4   Albumin 3.8 - 4.9 g/dL 4.3  3.5  2.5   AST 0 - 40 IU/L 21  34  18   ALT 0 - 32 IU/L 15  18  11    Alk Phosphatase 44 - 121 IU/L 102  118  94   Total Bilirubin 0.0 - 1.2 mg/dL 0.4  0.6  0.8       Current Medications:   Current Outpatient Medications (Endocrine & Metabolic):    predniSONE  (DELTASONE ) 10 MG tablet, Take 2 tablets (20 mg total) by mouth daily. Take 2 tablets, 20 mg total by mouth daily for 5 days.  Then take 1 tablet, 10 mg total by mouth for the remainder 5 days.   Current Outpatient Medications (Respiratory):    diphenhydrAMINE  (BENADRYL ) 25 MG tablet, Take 1 tablet (25 mg total) by mouth every 6 (six) hours.    Current Outpatient Medications (Other):    hydrOXYzine  (ATARAX ) 25 MG tablet, Take 1 tablet (25 mg total) by mouth 3 (three) times daily as needed.    triamcinolone  cream (KENALOG ) 0.1 %, Apply 1 Application topically 2 (two) times daily.  Medical History:  Past Medical History:  Diagnosis Date   Eczema 2010   Hypertension    Morbid obesity (HCC)    Allergies:  Allergies  Allergen Reactions   Tomato      Surgical History:  She  has a past surgical history that includes Cesarean section. Family History:  Her family history includes Diabetes in her brother, mother, and sister; Hypertension in her mother.  REVIEW OF SYSTEMS  : All other systems reviewed and negative except where noted in the History of Present Illness.  PHYSICAL EXAM: LMP 11/06/2013  Physical Exam          Melanie Gottron, PA-C 1:11 PM

## 2023-05-24 ENCOUNTER — Telehealth (INDEPENDENT_AMBULATORY_CARE_PROVIDER_SITE_OTHER): Payer: Self-pay | Admitting: Primary Care

## 2023-05-24 NOTE — Telephone Encounter (Signed)
 Called pt to confirm appt. Pt will be present.

## 2023-05-25 ENCOUNTER — Encounter (INDEPENDENT_AMBULATORY_CARE_PROVIDER_SITE_OTHER): Payer: Self-pay | Admitting: Primary Care

## 2023-05-25 ENCOUNTER — Ambulatory Visit (INDEPENDENT_AMBULATORY_CARE_PROVIDER_SITE_OTHER): Payer: Medicaid Other | Admitting: Primary Care

## 2023-05-25 VITALS — BP 120/78 | HR 67 | Resp 16 | Wt 164.8 lb

## 2023-05-25 DIAGNOSIS — L249 Irritant contact dermatitis, unspecified cause: Secondary | ICD-10-CM

## 2023-05-25 DIAGNOSIS — L309 Dermatitis, unspecified: Secondary | ICD-10-CM

## 2023-05-25 DIAGNOSIS — M25511 Pain in right shoulder: Secondary | ICD-10-CM | POA: Diagnosis not present

## 2023-05-25 NOTE — Progress Notes (Unsigned)
 [facility]  Melanie Bradshaw, is a 58 y.o. female  ZOX:[id number]  [id number]  DOB - 1965-03-13  Chief Complaint  Patient presents with   Shoulder Pain    Right        Subjective:   Melanie Bradshaw is a 58 y.o. female here today for an acute visit.  HPI  No problems updated.  Comprehensive ROS Pertinent positive and negative noted in HPI   Allergies  Allergen Reactions   Tomato     Past Medical History:  Diagnosis Date   Eczema 2010   Hypertension    Morbid obesity (HCC)     Current Outpatient Medications on File Prior to Visit  Medication Sig Dispense Refill   diphenhydrAMINE  (BENADRYL ) 25 MG tablet Take 1 tablet (25 mg total) by mouth every 6 (six) hours. 20 tablet 0   hydrOXYzine  (ATARAX ) 25 MG tablet Take 1 tablet (25 mg total) by mouth 3 (three) times daily as needed. 90 tablet 1   predniSONE  (DELTASONE ) 10 MG tablet Take 2 tablets (20 mg total) by mouth daily. Take 2 tablets, 20 mg total by mouth daily for 5 days.  Then take 1 tablet, 10 mg total by mouth for the remainder 5 days. 15 tablet 0   triamcinolone  cream (KENALOG ) 0.1 % Apply 1 Application topically 2 (two) times daily. 454 g 1   No current facility-administered medications on file prior to visit.   Health Maintenance  Topic Date Due   Eye exam for diabetics  Never done   Colon Cancer Screening  Never done   Zoster (Shingles) Vaccine (1 of 2) Never done   COVID-19 Vaccine (3 - 2024-25 season) 10/01/2022   Pap with HPV screening  07/09/2023   Hemoglobin A1C  08/22/2023   Flu Shot  08/31/2023   Yearly kidney function blood test for diabetes  02/22/2024   Yearly kidney health urinalysis for diabetes  02/22/2024   Mammogram  01/25/2025   DTaP/Tdap/Td vaccine (2 - Td or Tdap) 06/19/2029   Hepatitis C Screening  Completed   HIV Screening  Completed   HPV Vaccine  Aged Out   Meningitis B Vaccine  Aged Out   Complete foot exam   Discontinued    Objective:   Vitals:   05/25/23  0937  BP: 120/78  Pulse: 67  Resp: 16  SpO2: 100%  Weight: 164 lb 12.8 oz (74.8 kg)    Physical Exam Vitals reviewed.  Constitutional:      Appearance: Normal appearance. She is obese.  HENT:     Head: Normocephalic.     Right Ear: Tympanic membrane, ear canal and external ear normal.     Left Ear: Tympanic membrane, ear canal and external ear normal.     Nose: Nose normal.     Mouth/Throat:     Mouth: Mucous membranes are moist.  Eyes:     Extraocular Movements: Extraocular movements intact.     Pupils: Pupils are equal, round, and reactive to light.  Cardiovascular:     Rate and Rhythm: Normal rate.  Pulmonary:     Effort: Pulmonary effort is normal.     Breath sounds: Normal breath sounds.  Abdominal:     General: Bowel sounds are normal.     Palpations: Abdomen is soft.  Musculoskeletal:        General: Normal range of motion.     Cervical back: Normal range of motion.  Skin:    General: Skin is warm.     Findings:  Rash present.  Neurological:     Mental Status: She is alert and oriented to person, place, and time.  Psychiatric:        Mood and Affect: Mood normal.        Behavior: Behavior normal.        Thought Content: Thought content normal.     {Labs (Optional):23779}  Assessment & Plan   There are no diagnoses linked to this encounter.   Patient have been counseled extensively about nutrition and exercise. Other issues discussed during this visit include: low cholesterol diet, weight control and daily exercise, foot care, annual eye examinations at Ophthalmology, importance of adherence with medications and regular follow-up. We also discussed long term complications of uncontrolled diabetes and hypertension.   No follow-ups on file.  The patient was given clear instructions to go to ER or return to medical center if symptoms don't improve, worsen or new problems develop. The patient verbalized understanding. The patient was told to call to get lab  results if they haven't heard anything in the next week.   This note has been created with Education officer, environmental. Any transcriptional errors are unintentional.   [name], NP 05/25/2023, 10:08 AM

## 2023-05-28 ENCOUNTER — Encounter (INDEPENDENT_AMBULATORY_CARE_PROVIDER_SITE_OTHER): Payer: Self-pay | Admitting: Primary Care

## 2023-07-11 ENCOUNTER — Emergency Department (HOSPITAL_COMMUNITY)

## 2023-07-11 ENCOUNTER — Emergency Department (HOSPITAL_COMMUNITY)
Admission: EM | Admit: 2023-07-11 | Discharge: 2023-07-11 | Disposition: A | Discharge status: Home / Self Care | Attending: Emergency Medicine | Admitting: Emergency Medicine

## 2023-07-11 ENCOUNTER — Encounter (HOSPITAL_COMMUNITY): Payer: Self-pay

## 2023-07-11 ENCOUNTER — Other Ambulatory Visit: Payer: Self-pay

## 2023-07-11 DIAGNOSIS — M25531 Pain in right wrist: Secondary | ICD-10-CM | POA: Diagnosis not present

## 2023-07-11 DIAGNOSIS — R079 Chest pain, unspecified: Secondary | ICD-10-CM | POA: Insufficient documentation

## 2023-07-11 DIAGNOSIS — M79641 Pain in right hand: Secondary | ICD-10-CM | POA: Insufficient documentation

## 2023-07-11 DIAGNOSIS — M79601 Pain in right arm: Secondary | ICD-10-CM | POA: Diagnosis not present

## 2023-07-11 DIAGNOSIS — M5412 Radiculopathy, cervical region: Secondary | ICD-10-CM | POA: Insufficient documentation

## 2023-07-11 DIAGNOSIS — R200 Anesthesia of skin: Secondary | ICD-10-CM | POA: Diagnosis present

## 2023-07-11 LAB — CBC
HCT: 28.7 % — ABNORMAL LOW (ref 36.0–46.0)
Hemoglobin: 9.7 g/dL — ABNORMAL LOW (ref 12.0–15.0)
MCH: 33.4 pg (ref 26.0–34.0)
MCHC: 33.8 g/dL (ref 30.0–36.0)
MCV: 99 fL (ref 80.0–100.0)
Platelets: 195 10*3/uL (ref 150–400)
RBC: 2.9 MIL/uL — ABNORMAL LOW (ref 3.87–5.11)
RDW: 14 % (ref 11.5–15.5)
WBC: 3.9 10*3/uL — ABNORMAL LOW (ref 4.0–10.5)
nRBC: 0 % (ref 0.0–0.2)

## 2023-07-11 LAB — BASIC METABOLIC PANEL WITH GFR
Anion gap: 10 (ref 5–15)
BUN: 27 mg/dL — ABNORMAL HIGH (ref 6–20)
CO2: 20 mmol/L — ABNORMAL LOW (ref 22–32)
Calcium: 8.6 mg/dL — ABNORMAL LOW (ref 8.9–10.3)
Chloride: 102 mmol/L (ref 98–111)
Creatinine, Ser: 1.4 mg/dL — ABNORMAL HIGH (ref 0.44–1.00)
GFR, Estimated: 44 mL/min — ABNORMAL LOW (ref >60)
Glucose, Bld: 88 mg/dL (ref 70–99)
Potassium: 4.4 mmol/L (ref 3.5–5.1)
Sodium: 132 mmol/L — ABNORMAL LOW (ref 135–145)

## 2023-07-11 LAB — TROPONIN I (HIGH SENSITIVITY)
Troponin I (High Sensitivity): 2 ng/L (ref <18)
Troponin I (High Sensitivity): 3 ng/L (ref <18)

## 2023-07-11 MED ORDER — METHYLPREDNISOLONE 4 MG PO TBPK: ORAL_TABLET | ORAL | 0 refills | Status: AC

## 2023-07-11 MED ORDER — LACTATED RINGERS IV BOLUS
1000.0000 mL | Freq: Once | INTRAVENOUS | Status: AC
  Administered 2023-07-11: 1000 mL via INTRAVENOUS

## 2023-07-11 MED ORDER — DEXAMETHASONE SODIUM PHOSPHATE 10 MG/ML IJ SOLN
10.0000 mg | Freq: Once | INTRAMUSCULAR | Status: AC
  Administered 2023-07-11: 10 mg via INTRAVENOUS
  Filled 2023-07-11: qty 1

## 2023-07-11 NOTE — ED Provider Notes (Signed)
 [place] EMERGENCY DEPARTMENT AT [facility] Provider Note   CSN: [id number] Arrival date & time: 07/11/23  0857     History  Chief Complaint  Patient presents with   Chest Pain   Arm Pain   Wrist Pain      Melanie Bradshaw is a 58 y.o. female who presents with right hand and chest pain. Patient states she starting feeling pain in her right hand a few days ago. Also feels like it's numb. No falls/trauma. Chest pain started this morning. Non-exertional, feels like it is radiating to right hand, non-pleuritic, denies SOB, diaphoresis, n/v. Of note she was seen in April for right shoulder pain with plan to follow-up with orthopedics which she never did.    The history is provided by the patient.  Chest Pain Arm Pain Associated symptoms include chest pain.  Wrist Pain Associated symptoms include chest pain.       Home Medications Prior to Admission medications   Medication Sig Start Date End Date Taking? Authorizing Provider  methylPREDNISolone  (MEDROL  DOSEPAK) 4 MG TBPK tablet As instructed on box. 07/11/23  Yes Ronni Colace, DO  diphenhydrAMINE  (BENADRYL ) 25 MG tablet Take 1 tablet (25 mg total) by mouth every 6 (six) hours. 02/21/23   Horton, Sidra Dredge, DO  hydrOXYzine  (ATARAX ) 25 MG tablet Take 1 tablet (25 mg total) by mouth 3 (three) times daily as needed. 11/21/22   [name], NP  predniSONE  (DELTASONE ) 10 MG tablet Take 2 tablets (20 mg total) by mouth daily. Take 2 tablets, 20 mg total by mouth daily for 5 days.  Then take 1 tablet, 10 mg total by mouth for the remainder 5 days. 02/21/23   Horton, Sidra Dredge, DO  triamcinolone  cream (KENALOG ) 0.1 % Apply 1 Application topically 2 (two) times daily. 11/21/22   [name], NP      Allergies    Tomato    Review of Systems   Review of Systems  Cardiovascular:  Positive for chest pain.    Physical Exam Updated Vital Signs BP 105/82   Pulse (!) 56   Temp 98.4 F (36.9 C)   Resp 13   LMP  11/06/2013   SpO2 100%  Physical Exam Constitutional:      General: She is not in acute distress. HENT:     Head: Normocephalic and atraumatic.  Eyes:     Extraocular Movements: Extraocular movements intact.     Pupils: Pupils are equal, round, and reactive to light.  Cardiovascular:     Rate and Rhythm: Normal rate and regular rhythm.     Heart sounds: Normal heart sounds.  Pulmonary:     Effort: Pulmonary effort is normal.     Breath sounds: Normal breath sounds.  Musculoskeletal:     Right hand: No swelling, deformity, lacerations or tenderness. Normal strength.     Left hand: Normal.  Skin:    General: Skin is warm and dry.  Neurological:     General: No focal deficit present.     Mental Status: She is alert and oriented to person, place, and time.     Cranial Nerves: No cranial nerve deficit.     Comments: Spurling negative     ED Results / Procedures / Treatments   Labs (all labs ordered are listed, but only abnormal results are displayed) Labs Reviewed  BASIC METABOLIC PANEL WITH GFR - Abnormal; Notable for the following components:      Result Value   Sodium 132 (*)  CO2 20 (*)    BUN 27 (*)    Creatinine, Ser 1.40 (*)    Calcium 8.6 (*)    GFR, Estimated 44 (*)    All other components within normal limits  CBC - Abnormal; Notable for the following components:   WBC 3.9 (*)    RBC 2.90 (*)    Hemoglobin 9.7 (*)    HCT 28.7 (*)    All other components within normal limits  TROPONIN I (HIGH SENSITIVITY)  TROPONIN I (HIGH SENSITIVITY)    EKG EKG Interpretation Date/Time:  Wednesday July 11 2023 09:12:46 EDT Ventricular Rate:  66 PR Interval:  172 QRS Duration:  92 QT Interval:  425 QTC Calculation: 446 R Axis:   43  Text Interpretation: Sinus rhythm Borderline low voltage, extremity leads No significant change since last tracing Confirmed by Sueellen Emery [phone]) on 07/11/2023 9:17:09 AM  Radiology DG Chest Port 1 View Result Date:  07/11/2023 CLINICAL DATA:  Chest pain. EXAM: PORTABLE CHEST 1 VIEW COMPARISON:  03/28/2020. FINDINGS: Stable mild cardiomegaly. Mediastinal contours are within normal limits. Both lungs are clear. No pleural effusion or pneumothorax. No acute osseous abnormality. IMPRESSION: Stable mild cardiomegaly.  No acute cardiopulmonary findings. Electronically Signed   By: Mannie Seek M.D.   On: 07/11/2023 09:49    Procedures Procedures    Medications Ordered in ED Medications  dexamethasone  (DECADRON ) injection 10 mg (10 mg Intravenous Given 07/11/23 1011)  lactated ringers bolus 1,000 mL (1,000 mLs Intravenous New Bag/Given 07/11/23 1026)    ED Course/ Medical Decision Making/ A&P                                 Medical Decision Making Initial Impression: 58 year old female presents with right hand and chest pain.  She is in no acute distress and vitals are unremarkable.  Low concern for ACS or PE (Well's Criteria is 0). Per chart review she was seen in April for right shoulder pain with plan to follow-up with orthopedics which she never did.  Unremarkable physical exam without focal deficit.  Symptoms consistent with radicular pattern despite negative Spurling.  Will check troponin, EKG, CBC, BMP, CXR.  Will provide IV Decadron  in the meantime to see if it improves symptoms.  Update: EKG shows normal sinus rhythm and troponin is WNL. CXR unremarkable aside from stable mild cardiomegaly.  CBC with stable normocytic anemia which is at or better than baseline.  BMP shows mild AKI with creatinine of 1.4 and baseline ~ 0.9. BUN also mildly elevated with patten consistent with dehydration. She denies NSAID use, reduced p.o. intake, and dysuria.  Will give IVF bolus.   Update: Patient states symptoms have resolved and she is comfortable with discharge. Will provide Medrol  dose pack sent to pharmacy and she will follow-up with PCP in 1 week for continued management and repeat lab work to ensure kidney  function normalizes.  Symptoms consistent with cervical radiculopathy so we will have patient follow-up with PCP for potential orthopedic referral as this was originally the plan in April.  Patient also instructed on importance of hydration.  Return precautions given.  Amount and/or Complexity of Data Reviewed Labs: ordered. Radiology: ordered.  Risk Prescription drug management.            Final Clinical Impression(s) / ED Diagnoses Final diagnoses:  Cervical radiculopathy    Rx / DC Orders ED Discharge Orders  Ordered    methylPREDNISolone  (MEDROL  DOSEPAK) 4 MG TBPK tablet        07/11/23 1241              Ronni Colace, DO 07/11/23 1251    Sueellen Emery, MD 07/11/23 1402

## 2023-07-11 NOTE — Discharge Instructions (Addendum)
 You were seen for hand and chest pain. Thankfully your work-up has been reassuring. Your symptoms are consistent with nerve related pain from your neck. This was treated with steroids here in the ED, and I will send a pill form of this medication to your pharmacy. Please take as prescribed. Your lab work also revealed that your kidney function was a little worse than normal. This appears due to dehydration. Please focus on hydration using water. As we discussed, if you feel thirsty, you are already dehydrated. Call and make an appointment with your PCP for some time next week so she can repeat labs to ensure your kidney function has normalized. Please call PCP/return to ED if you develop worsening chest pain and/or weakness in your right arm especially if associated with shortness of breath.

## 2023-07-11 NOTE — ED Triage Notes (Signed)
 Pt arrives via EMS from work. Pt woke up around 0430 this morning with right upper chest, right arm pain and right wrist pain. Pt reports pain is worse with movement or touch. Pt is AxOx4.   EMS did administer 324mg  of aspirin.

## 2023-07-18 ENCOUNTER — Telehealth (INDEPENDENT_AMBULATORY_CARE_PROVIDER_SITE_OTHER): Payer: Self-pay | Admitting: Primary Care

## 2023-07-18 NOTE — Telephone Encounter (Signed)
 Error

## 2023-08-30 ENCOUNTER — Telehealth (INDEPENDENT_AMBULATORY_CARE_PROVIDER_SITE_OTHER): Payer: Self-pay | Admitting: Primary Care

## 2023-08-30 NOTE — Telephone Encounter (Signed)
 Called pt to remind them about appt. Pt did not answer and VM was left. Please advise

## 2023-08-31 ENCOUNTER — Ambulatory Visit: Admitting: Nurse Practitioner

## 2023-08-31 ENCOUNTER — Ambulatory Visit (INDEPENDENT_AMBULATORY_CARE_PROVIDER_SITE_OTHER): Admitting: Primary Care

## 2023-08-31 ENCOUNTER — Encounter (INDEPENDENT_AMBULATORY_CARE_PROVIDER_SITE_OTHER): Payer: Self-pay | Admitting: Primary Care

## 2023-08-31 VITALS — BP 138/83 | HR 62 | Resp 16 | Wt 165.2 lb

## 2023-08-31 DIAGNOSIS — Z1211 Encounter for screening for malignant neoplasm of colon: Secondary | ICD-10-CM

## 2023-08-31 DIAGNOSIS — R7303 Prediabetes: Secondary | ICD-10-CM | POA: Diagnosis not present

## 2023-08-31 DIAGNOSIS — H547 Unspecified visual loss: Secondary | ICD-10-CM

## 2023-08-31 NOTE — Progress Notes (Signed)
 [facility]  Melanie Bradshaw, is a 58 y.o. female  [id number]:[id number]  FMW:[id number]  DOB - 1965-08-26  No chief complaint on file.      Subjective:   Melanie Bradshaw is a 58 y.o. female here today for a follow up visit. Patient has No headache, No chest pain, No abdominal pain - No Nausea, No new weakness tingling or numbness, No Cough - shortness of breath HPI  No problems updated.  Comprehensive ROS Pertinent positive and negative noted in HPI   Allergies  Allergen Reactions   Tomato     Past Medical History:  Diagnosis Date   Eczema 2010   Hypertension    Morbid obesity (HCC)     Current Outpatient Medications on File Prior to Visit  Medication Sig Dispense Refill   diphenhydrAMINE  (BENADRYL ) 25 MG tablet Take 1 tablet (25 mg total) by mouth every 6 (six) hours. 20 tablet 0   hydrOXYzine  (ATARAX ) 25 MG tablet Take 1 tablet (25 mg total) by mouth 3 (three) times daily as needed. 90 tablet 1   methylPREDNISolone  (MEDROL  DOSEPAK) 4 MG TBPK tablet As instructed on box. 21 each 0   predniSONE  (DELTASONE ) 10 MG tablet Take 2 tablets (20 mg total) by mouth daily. Take 2 tablets, 20 mg total by mouth daily for 5 days.  Then take 1 tablet, 10 mg total by mouth for the remainder 5 days. 15 tablet 0   triamcinolone  cream (KENALOG ) 0.1 % Apply 1 Application topically 2 (two) times daily. 454 g 1   No current facility-administered medications on file prior to visit.   Health Maintenance  Topic Date Due   Eye exam for diabetics  Never done   Pneumococcal Vaccine for age over 35 (1 of 2 - PCV) Never done   Hepatitis B Vaccine (1 of 3 - 19+ 3-dose series) Never done   Colon Cancer Screening  Never done   Zoster (Shingles) Vaccine (1 of 2) Never done   COVID-19 Vaccine (3 - 2024-25 season) 10/01/2022   Pap with HPV screening  07/09/2023   Hemoglobin A1C  08/22/2023   Flu Shot  08/31/2023   Yearly kidney health urinalysis for diabetes  02/22/2024   Yearly kidney function  blood test for diabetes  07/10/2024   Mammogram  01/25/2025   DTaP/Tdap/Td vaccine (2 - Td or Tdap) 06/19/2029   Hepatitis C Screening  Completed   HIV Screening  Completed   HPV Vaccine  Aged Out   Meningitis B Vaccine  Aged Out   Complete foot exam   Discontinued    Objective:   Vitals:   08/31/23 1041  BP: 138/83  Pulse: 62  Resp: 16  SpO2: 100%  Weight: 165 lb 3.2 oz (74.9 kg)    Physical Exam Vitals reviewed.  Constitutional:      Appearance: She is obese.  HENT:     Head: Normocephalic.     Right Ear: Tympanic membrane, ear canal and external ear normal.     Left Ear: Tympanic membrane, ear canal and external ear normal.     Nose: Nose normal.  Cardiovascular:     Rate and Rhythm: Normal rate and regular rhythm.  Pulmonary:     Effort: Pulmonary effort is normal.     Breath sounds: Normal breath sounds.  Musculoskeletal:        General: Normal range of motion.     Cervical back: Normal range of motion.  Skin:    Comments: Depigmentation   Neurological:  Mental Status: She is alert and oriented to person, place, and time.       Assessment & Plan  Diagnoses and all orders for this visit:  Vision problems -     Ambulatory referral to Ophthalmology  Prediabetes -     POCT HgB A1C  Colon cancer screening -     Ambulatory referral to Gastroenterology    Patient have been counseled extensively about nutrition and exercise. Other issues discussed during this visit include: low cholesterol diet, weight control and daily exercise, foot care, annual eye examinations at Ophthalmology, importance of adherence with medications and regular follow-up. We also discussed long term complications of uncontrolled diabetes and hypertension.   Return in about 6 months (around 03/02/2024) for medical conditions.  The patient was given clear instructions to go to ER or return to medical center if symptoms don't improve, worsen or new problems develop. The patient  verbalized understanding. The patient was told to call to get lab results if they haven't heard anything in the next week.   This note has been created with Education officer, environmental. Any transcriptional errors are unintentional.   Rosaline SHAUNNA Bohr, NP 09/03/2023, 2:42 AM

## 2023-08-31 NOTE — Patient Instructions (Signed)
 Pap Test Why am I having this test? A Pap test, also called a Pap smear, is a screening test to check for signs of: Infection. Cancer of the cervix. The cervix is the lower part of the uterus that opens into the vagina. Changes that may be a sign that cancer is developing (precancerous changes). Women need this test on a regular basis. In general, you should have a Pap test every 3 years until you reach menopause or age 58. Women aged 30-60 may choose to have their Pap test done at the same time as an HPV (human papillomavirus) test every 5 years (instead of every 3 years). Your health care provider may recommend having Pap tests more or less often depending on your medical conditions and past Pap test results. What is being tested? Cervical cells are tested for signs of infection or abnormalities. What kind of sample is taken?  Your health care provider will collect a sample of cells from the surface of your cervix. This will be done using a small cotton swab, plastic spatula, or brush that is inserted into your vagina using a tool called a speculum. This sample is often collected during a pelvic exam, when you are lying on your back on an exam table with your feet in footrests (stirrups). In some cases, fluids (secretions) from the cervix or vagina may also be collected. How do I prepare for this test? Be aware of where you are in your menstrual cycle. If you are menstruating on the day of the test, you may be asked to reschedule. You may need to reschedule if you have a known vaginal infection on the day of the test. Follow instructions from your health care provider about: Changing or stopping your regular medicines. Some medicines can cause abnormal test results, such as vaginal medicines and tetracycline. Avoiding douching 2-3 days before or the day of the test. Tell a health care provider about: Any allergies you have. All medicines you are taking, including vitamins, herbs, eye drops,  creams, and over-the-counter medicines. Any bleeding problems you have. Any surgeries you have had. Any medical conditions you have. Whether you are pregnant or may be pregnant. How are the results reported? Your test results will be reported as either abnormal or normal. What do the results mean? A normal test result means that you do not have signs of cancer of the cervix. An abnormal result may mean that you have: Cancer. A Pap test by itself is not enough to diagnose cancer. You will have more tests done if cancer is suspected. Precancerous changes in your cervix. Inflammation of the cervix. An STI (sexually transmitted infection). A fungal infection. A parasite infection. Talk with your health care provider about what your results mean. In some cases, your health care provider may do more testing to confirm the results. Questions to ask your health care provider Ask your health care provider, or the department that is doing the test: When will my results be ready? How will I get my results? What are my treatment options? What other tests do I need? What are my next steps? Summary In general, women should have a Pap test every 3 years until they reach menopause or age 21. Your health care provider will collect a sample of cells from the surface of your cervix. This will be done using a small cotton swab, plastic spatula, or brush. In some cases, fluids (secretions) from the cervix or vagina may also be collected. This information is not  intended to replace advice given to you by your health care provider. Make sure you discuss any questions you have with your health care provider. Document Revised: 04/16/2020 Document Reviewed: 04/16/2020 Elsevier Patient Education  2025 [employer].

## 2023-09-03 LAB — POCT GLYCOSYLATED HEMOGLOBIN (HGB A1C): Hemoglobin A1C: 5 % (ref 4.0–5.6)

## 2024-02-29 ENCOUNTER — Telehealth: Payer: Self-pay | Admitting: Primary Care

## 2024-02-29 NOTE — Telephone Encounter (Signed)
 Called pt to confirm appt. Pt did not answer and I explained that pt has an appt at [facility] - [address] [place] [address]. Please advise

## 2024-03-03 ENCOUNTER — Ambulatory Visit: Admitting: Primary Care

## 2024-03-07 ENCOUNTER — Telehealth: Payer: Self-pay | Admitting: Primary Care

## 2024-03-07 NOTE — Telephone Encounter (Signed)
 Spoke to pt about upcoming appt.. Will be present

## 2024-03-10 ENCOUNTER — Ambulatory Visit: Admitting: Primary Care
